# Patient Record
Sex: Male | Born: 1970 | Race: White | Hispanic: No | Smoking: Current every day smoker
Health system: Southern US, Community
[De-identification: ages and names within clinical notes are randomized; demographics above are authoritative.]

## PROBLEM LIST (undated history)

## (undated) DIAGNOSIS — K219 Gastro-esophageal reflux disease without esophagitis: Secondary | ICD-10-CM

## (undated) DIAGNOSIS — Z8709 Personal history of other diseases of the respiratory system: Secondary | ICD-10-CM

## (undated) DIAGNOSIS — J449 Chronic obstructive pulmonary disease, unspecified: Secondary | ICD-10-CM

---

## 2011-01-19 ENCOUNTER — Encounter (INDEPENDENT_AMBULATORY_CARE_PROVIDER_SITE_OTHER): Payer: Self-pay | Admitting: *Deleted

## 2011-01-19 ENCOUNTER — Encounter: Payer: Self-pay | Admitting: Emergency Medicine

## 2011-01-19 ENCOUNTER — Inpatient Hospital Stay (INDEPENDENT_AMBULATORY_CARE_PROVIDER_SITE_OTHER)
Admission: RE | Admit: 2011-01-19 | Discharge: 2011-01-19 | Disposition: A | Discharge status: Home / Self Care | Payer: 59 | Source: Ambulatory Visit | Attending: Emergency Medicine | Admitting: Emergency Medicine

## 2011-01-19 ENCOUNTER — Ambulatory Visit
Admission: RE | Admit: 2011-01-19 | Discharge: 2011-01-19 | Disposition: A | Discharge status: Home / Self Care | Payer: 59 | Source: Ambulatory Visit | Attending: Emergency Medicine | Admitting: Emergency Medicine

## 2011-01-19 ENCOUNTER — Other Ambulatory Visit: Payer: Self-pay | Admitting: Emergency Medicine

## 2011-01-19 DIAGNOSIS — R05 Cough: Secondary | ICD-10-CM

## 2011-01-19 DIAGNOSIS — R059 Cough, unspecified: Secondary | ICD-10-CM

## 2011-01-19 DIAGNOSIS — R062 Wheezing: Secondary | ICD-10-CM

## 2011-01-19 DIAGNOSIS — J180 Bronchopneumonia, unspecified organism: Secondary | ICD-10-CM

## 2011-01-20 ENCOUNTER — Telehealth (INDEPENDENT_AMBULATORY_CARE_PROVIDER_SITE_OTHER): Payer: Self-pay | Admitting: *Deleted

## 2011-01-31 ENCOUNTER — Institutional Professional Consult (permissible substitution): Payer: 59 | Admitting: Critical Care Medicine

## 2011-02-03 ENCOUNTER — Institutional Professional Consult (permissible substitution): Payer: 59 | Admitting: Emergency Medicine

## 2011-05-09 NOTE — Telephone Encounter (Signed)
  Phone Note Outgoing Call   Call placed by: Lajean Saver RN,  January 20, 2011 8:15 AM Call placed to: [facility] Action Taken: Appt scheduled Summary of Call: Appointment scheduled for [facility] on 01/31/2011 @ 1030am @ [facility]. Patient notified of appointment while still in the office.

## 2011-05-09 NOTE — Letter (Signed)
Summary: Out of Work  [facility]  [address]   [place], [place] [address]   Phone: [phone]  Fax: [phone]    January 19, 2011   Employee:  Devin Carter    To Whom It May Concern:   For Medical reasons, please excuse the above named employee from work for the following dates:  Start:   01/19/2011  Return: 01/20/2011  If you need additional information, please feel free to contact our office.         Sincerely,    Clemens Catholic LPN

## 2011-05-09 NOTE — Progress Notes (Signed)
Summary: chest congestion   Vital Signs:  Patient Profile:   40 Years Old Male CC:      Cold & URI symptoms x 1wk  Weight:      212 pounds O2 Sat:      97 % O2 treatment:    Room Air Temp:     99.0 degrees F oral Pulse rate:   70 / minute Resp:     18 per minute BP sitting:   133 / 88  (left arm) Cuff size:   regular  Vitals Entered By: Clemens Catholic LPN (January 19, 2011 2:17 PM)                 Serial Vital Signs/Assessments:                                PEF    PreRx  PostRx Time      O2 Sat  O2 Type     L/min  L/min  L/min   By 3:11 PM   97  %   Room air                          Clemens Catholic LPN  Comments: 1:61 PM after nebulizer treatment By: Clemens Catholic LPN     Updated Prior Medication List: OMEPRAZOLE 20 MG CPDR (OMEPRAZOLE)  XANAX 0.25 MG TABS (ALPRAZOLAM)  MINOCYCLINE HCL 100 MG CAPS (MINOCYCLINE HCL)   Current Allergies: ! * CHANTIXHistory of Present Illness History from: patient Chief Complaint: Cold & URI symptoms x 1wk  History of Present Illness: Pt complains of  7 days of congestion, colored rhinorrhea, sputum. No sore throat.  +cough, Minimal dyspnea. No chest pain. + wheezing.  No nausea No vomiting. + fever,T max 101 No chills. Was seen 3 months ago at [facility] medical crn[facility]  ER for pleural effusion , w/u with ct with contrast,  tx d with shot and by mouth steroids and abx, fu with his pcp Corinna Capra at [facility] 2 and 1/2 months ago . Has lost 15 lbs since. +smoker  REVIEW OF SYSTEMS Constitutional Symptoms       Complains of fever, chills, night sweats, and weight loss.     Denies weight gain and fatigue.  Eyes       Complains of change in vision.      Denies eye pain, eye discharge, glasses, contact lenses, and eye surgery. Ear/Nose/Throat/Mouth       Complains of frequent runny nose and sinus problems.      Denies hearing loss/aids, change in hearing, ear pain, ear discharge, dizziness, frequent nose  bleeds, sore throat, hoarseness, and tooth pain or bleeding.  Respiratory       Complains of productive cough, wheezing, shortness of breath, asthma, and bronchitis.      Denies dry cough and emphysema/COPD.  Cardiovascular       Denies murmurs, chest pain, and tires easily with exhertion.    Gastrointestinal       Denies stomach pain, nausea/vomiting, diarrhea, constipation, blood in bowel movements, and indigestion. Genitourniary       Complains of painful urination.      Denies kidney stones and loss of urinary control. Neurological       Complains of headaches.      Denies paralysis, seizures, and fainting/blackouts. Musculoskeletal       Denies muscle pain, joint pain,  joint stiffness, decreased range of motion, redness, swelling, muscle weakness, and gout.  Skin       Denies bruising, unusual mles/lumps or sores, and hair/skin or nail changes.  Psych       Complains of anxiety/stress.      Denies mood changes, temper/anger issues, speech problems, depression, and sleep problems. Other Comments: pt c/o cold s/s x 1wk. he states that his productive cough has been worse x 2days and he has had a low grade fever. he took codeine cough syrup before bed last night which helped a little.    Past History:  Past Medical History: pleuritis  Past Surgical History: Unremarkable  Family History: heart dz diabetes  Social History: Current Smoker 1- 1.5 PPD Alcohol use-yes 12 + per wk Drug use-no Smoking Status:  current Drug Use:  no Physical Exam General appearance: well developed, well nourished, no acute distress, fatigued. Coughing occasionally. Head: normocephalic, atraumatic Eyes: conjunctivae and lids normal. No icterus Ears: normal, no lesions or deformities Nasal: swollen red turbinates with congestion, mild yellow d/c Oral/Pharynx: pharyngeal erythema without exudate, uvula midline without deviation. No lesions seen Neck: neck supple,  trachea midline, no  masses Chest/Lungs: scattered late-exp wheezes throughout all lobes. scattered rhonchi. Mild rales right base. Breath sounds = bilat. 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  Heart size normal.  There is a small area of added density in the lower right hemithorax, which is superimposed with ribs.  Lungs are otherwise clear.  No pleural fluid. IMPRESSION: Added density in the lower right hemithorax may be due to a summation shadow.  True airspace disease or nodule cannot be excluded.  Follow-up in 4-6 weeks is recommended to ensure resolution."  After Duoneb tx, Lungs improved. Improved air movement. Less wheezing. He stated he was breathing better.  Plan New Medications/Changes: VENTOLIN HFA 108 (90 BASE) MCG/ACT AERS (ALBUTEROL SULFATE) 2 INH q 4-6 hrs as needed for wheezing  #1 INHALER x 0, 01/19/2011, Lajean Manes MD PREDNISONE 20 MG TABS (PREDNISONE) 1 by mouth two times a day for 7 days  #14 x 0, 01/19/2011, Lajean Manes MD LEVAQUIN 500 MG TABS (LEVOFLOXACIN) 1 by mouth daily for 10 days (antibiotic)  #10 x 0, 01/19/2011, Lajean Manes MD  New Orders: T-DG Chest 2 View [71020] Albuterol up to 2.5mg  with Ipratropium [J7620] Albuterol Sulfate Sol 1mg  unit dose [Z6109] Ipratropium inhalation sol. unit dose [J7644] Nebulizer Tx [94640] Depo- Medrol 80mg  [J1040] Rocephin  250mg  [J0696] New Patient Level IV [99204] Depo- Medrol 40mg  [J1030] Admin of Therapeutic Inj  intramuscular or subcutaneous [96372] Planning Comments:   Duoneb nebulizer tx here, with good results, see above. Rocephin and Depomedrol IM given after rba discussed. Discussed dx's above and the specific CXR  report above. I explained that I cannot rule out a lung mass, or even lung cancer. We discussed the importance of quitting smoking, taking meds as rx'd, and following up with lung specialist as instructed. Risks, benefits, alternatives discussed. I explained the risks of not following my advice , including the risks of worsening medical condition, or even death. Pt voiced understanding and agreement with my recommendations.  Follow Up: Will arrange f/u with a lung specialist within 3-4 weeks, sooner if worse or new symptoms.---Also, f/u with pcp for  all other medical concerns.  The patient and/or caregiver has been counseled thoroughly with regard to medications prescribed including dosage, schedule, interactions, rationale for use, and possible side effects and they verbalize understanding.  Diagnoses and expected course of recovery discussed and will return if not improved as expected or if the condition worsens. Patient and/or caregiver verbalized understanding.  Prescriptions: VENTOLIN HFA 108 (90 BASE) MCG/ACT AERS (ALBUTEROL SULFATE) 2 INH q 4-6 hrs as needed for wheezing  #1 INHALER x 0   Entered and Authorized by:   Lajean Manes MD   Signed by:   Lajean Manes MD on 01/19/2011   Method used:   Electronically to        [facility] [phone]* (retail)       [address]       [place], [place]  [address]       Ph: [phone]       Fax: [phone]   RxID:   [id number] PREDNISONE 20 MG TABS (PREDNISONE) 1 by mouth two times a day for 7 days  #14 x 0   Entered and Authorized by:   Lajean Manes MD   Signed by:   Lajean Manes MD on 01/19/2011   Method used:   Electronically to        [facility] [phone]* (retail)       [address]       [place], [place]  [address]       Ph: [phone]       Fax: [phone]   RxID:   [id number] LEVAQUIN 500 MG TABS (LEVOFLOXACIN) 1 by mouth daily for 10 days (antibiotic)  #10 x 0   Entered and Authorized by:   Lajean Manes MD    Signed by:   Lajean Manes MD on 01/19/2011   Method used:   Electronically to        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)    Route: IM    Site: RUOQ gluteus    Exp Date: 04/07/2011    Lot #: [id number]    Mfr: Pharmacia    Comments: 80 mg given     Patient tolerated injection without complications    Given by: Clemens Catholic LPN (January 19, 2011 3:44 PM)  Medication # 1:    Medication: Albuterol Sulfate Sol 1mg  unit dose    Diagnosis: WHEEZING (ICD-786.07)    Dose: 3.0mg      Route: inhaled    Exp Date: 04/07/2011    Lot #: [id number]    Mfr: mylan    Comments: douneb given    Patient tolerated medication without complications    Given by: Neysa Bonito  Locklear LPN (January 19, 2011 3:16 PM)  Medication # 2:    Medication: Ipratropium inhalation sol. unit dose    Diagnosis: WHEEZING (ICD-786.07)    Dose: 0.5mg      Route: inhaled    Exp Date: 04/07/2011    Lot #: [id number]    Mfr: mylan     Comments: douneb given     Patient tolerated medication without complications    Given by: Clemens Catholic LPN (January 19, 2011 3:17 PM)  Orders Added: 1)  T-DG Chest 2 View [71020] 2)  Albuterol up to 2.5mg  with Ipratropium [J7620] 3)  Albuterol Sulfate Sol 1mg  unit dose [J7613] 4)  Ipratropium inhalation sol. unit dose [J7644] 5)  Nebulizer Tx [94640] 6)  Depo- Medrol 80mg  [J1040] 7)   Rocephin  250mg  [J0696] 8)  New Patient Level IV [99204] 9)  Depo- Medrol 40mg  [J1030] 10)  Admin of Therapeutic Inj  intramuscular or subcutaneous [16109]

## 2011-09-01 ENCOUNTER — Encounter: Payer: Self-pay | Admitting: Emergency Medicine

## 2011-09-01 ENCOUNTER — Emergency Department: Admit: 2011-09-01 | Discharge: 2011-09-01 | Disposition: A | Discharge status: Home / Self Care | Payer: 59

## 2011-09-01 ENCOUNTER — Emergency Department
Admission: EM | Admit: 2011-09-01 | Discharge: 2011-09-01 | Disposition: A | Discharge status: Home / Self Care | Payer: 59 | Source: Home / Self Care | Attending: Emergency Medicine | Admitting: Emergency Medicine

## 2011-09-01 DIAGNOSIS — R0781 Pleurodynia: Secondary | ICD-10-CM

## 2011-09-01 DIAGNOSIS — R079 Chest pain, unspecified: Secondary | ICD-10-CM

## 2011-09-01 MED ORDER — TRAMADOL-ACETAMINOPHEN 37.5-325 MG PO TABS: 1.0000 | ORAL_TABLET | Freq: Four times a day (QID) | ORAL | Status: AC | PRN

## 2011-09-01 NOTE — ED Provider Notes (Signed)
History     CSN: [id number]  Arrival date & time 09/01/11  1305   First MD Initiated Contact with Patient 09/01/11 1321      Chief Complaint  Patient presents with  . Chest Pain    (Consider location/radiation/quality/duration/timing/severity/associated sxs/prior treatment) HPI This patient presents today with left lower rib pain.  He states that he sneezed this morning and heard and felt a pop in his lower left chest.  He states that since then he has had discomfort and tenderness that he describes as a constant sharp pain.  Otherwise no recent illnesses, no other chest pain, shortness of breath, or cardiac symptoms.  He has broken a rib previously on the right side.  No recent heavy lifting, pushing, pulling.  He is here to make sure that he did not break anything.  History reviewed. No pertinent past medical history.  History reviewed. No pertinent past surgical history.  Family History  Problem Relation Age of Onset  . Heart failure Mother   . Hyperlipidemia Mother   . Diabetes Mother   . Heart failure Father     History  Substance Use Topics  . Smoking status: Current Everyday Smoker -- 1.5 packs/day for 30 years  . Smokeless tobacco: Not on file  . Alcohol Use: Yes      Review of Systems  All other systems reviewed and are negative.    Allergies  Varenicline tartrate  Home Medications  No current outpatient prescriptions on file.  BP 129/84  Pulse 75  Temp(Src) 98.1 F (36.7 C) (Oral)  Resp 16  Ht 6\' 1"  (1.854 m)  Wt 227 lb (102.967 kg)  BMI 29.95 kg/m2  SpO2 98%  Physical Exam  Nursing note and vitals reviewed. Constitutional: He is oriented to person, place, and time. He appears well-developed and well-nourished.  HENT:  Head: Normocephalic and atraumatic.  Eyes: No scleral icterus.  Neck: Neck supple.  Cardiovascular: Normal rate, regular rhythm and normal heart sounds.   Pulmonary/Chest: Effort normal and breath sounds normal. No accessory  muscle usage. No apnea. No respiratory distress. He has no decreased breath sounds. He has no wheezes. He has no rhonchi.         Tenderness to palpation is shown in the figure.  No crepitus felt, no swelling, no bruising.  Neurological: He is alert and oriented to person, place, and time.  Skin: Skin is warm and dry.  Psychiatric: He has a normal mood and affect. His speech is normal.    ED Course  Procedures (including critical care time)  Labs Reviewed - No data to display Dg Ribs Unilateral W/chest Left  09/01/2011  *RADIOLOGY REPORT*  Clinical Data: Left lower rib pain.  Sneezed and felt a pop.  Rule out fracture.  LEFT RIBS AND CHEST - 3+ VIEW  Comparison: 01/19/2011  Findings: Frontal view of the chest demonstrates midline trachea. Normal heart size and mediastinal contours. No pleural effusion or pneumothorax.  Clear lungs.  2 views of left sided ribs. Radiographic marker projects over the posterior left twelfth rib. No displaced rib fracture identified.  IMPRESSION: No displaced rib fracture or pneumothorax. No acute cardiopulmonary disease.  Original Report Authenticated By: Consuello Bossier, M.D.     1. Rib pain       MDM   An x-ray was obtained and read by the radiologist as above.  We tried a rib belt on him to see if that would relieve some of the pressure.  Advised ice and  avoiding heavy lifting, pushing, pulling.  Rx for Ultracet given.  Marlaine Hind, MD 09/01/11 1351

## 2011-09-01 NOTE — ED Notes (Signed)
Left rib pain, sneezed thi morning and heard a pop, pain immediately

## 2012-04-03 ENCOUNTER — Emergency Department (INDEPENDENT_AMBULATORY_CARE_PROVIDER_SITE_OTHER): Payer: 59

## 2012-04-03 ENCOUNTER — Emergency Department
Admission: EM | Admit: 2012-04-03 | Discharge: 2012-04-03 | Disposition: A | Discharge status: Home / Self Care | Payer: 59 | Source: Home / Self Care | Attending: Family Medicine | Admitting: Family Medicine

## 2012-04-03 ENCOUNTER — Encounter: Payer: Self-pay | Admitting: *Deleted

## 2012-04-03 DIAGNOSIS — J209 Acute bronchitis, unspecified: Secondary | ICD-10-CM

## 2012-04-03 DIAGNOSIS — R05 Cough: Secondary | ICD-10-CM

## 2012-04-03 DIAGNOSIS — R0781 Pleurodynia: Secondary | ICD-10-CM

## 2012-04-03 DIAGNOSIS — R6883 Chills (without fever): Secondary | ICD-10-CM

## 2012-04-03 DIAGNOSIS — M549 Dorsalgia, unspecified: Secondary | ICD-10-CM

## 2012-04-03 DIAGNOSIS — R0789 Other chest pain: Secondary | ICD-10-CM

## 2012-04-03 DIAGNOSIS — R079 Chest pain, unspecified: Secondary | ICD-10-CM

## 2012-04-03 HISTORY — DX: Chronic obstructive pulmonary disease, unspecified: J44.9

## 2012-04-03 HISTORY — DX: Gastro-esophageal reflux disease without esophagitis: K21.9

## 2012-04-03 HISTORY — DX: Personal history of other diseases of the respiratory system: Z87.09

## 2012-04-03 LAB — POCT URINALYSIS DIPSTICK
Blood, UA: NEGATIVE
Leukocytes, UA: NEGATIVE
Nitrite, UA: NEGATIVE
Protein, UA: NEGATIVE
pH, UA: 7.5 (ref 5–8)

## 2012-04-03 LAB — POCT CBC W AUTO DIFF (K'VILLE URGENT CARE)

## 2012-04-03 MED ORDER — AZITHROMYCIN 250 MG PO TABS: ORAL_TABLET | ORAL | Status: DC

## 2012-04-03 MED ORDER — BENZONATATE 200 MG PO CAPS: 200.0000 mg | ORAL_CAPSULE | Freq: Every day | ORAL | Status: DC

## 2012-04-03 NOTE — ED Provider Notes (Signed)
History     CSN: [id number]  Arrival date & time 04/03/12  4098   First MD Initiated Contact with Patient 04/03/12 [phone]      Chief Complaint  Patient presents with  . Back Pain     HPI Comments: Patient complains of onset of right sided posterior rib pain at about 11AM yesterday.  He denies recent trauma to his chest.  The pain is worse with inspiration and movement.  He notes that he has had some mild cough for about a week, now worse at night.  The cough is generally productive. He denies wheezing but has mild shortness of breath with activity, improved with his albuterol inhaler.  He had chills/sweats last night but no fever.  He also recalls having had some vague urinary symptoms with urgency last week but it resolved with increased fluid intake. He states that he had sudden onset of right posterior rib pain last year, and subsequently developed a pleural effusion on the right.  He continues to smoke.  The history is provided by the patient.    Past Medical History  Diagnosis Date  . GERD (gastroesophageal reflux disease)   . Skin disease   . COPD (chronic obstructive pulmonary disease)   . Hx of pleurisy     History reviewed. No pertinent past surgical history.  Family History  Problem Relation Age of Onset  . Heart failure Mother   . Hyperlipidemia Mother   . Diabetes Mother   . Heart failure Father     History  Substance Use Topics  . Smoking status: Current Every Day Smoker -- 1.5 packs/day for 30 years  . Smokeless tobacco: Not on file  . Alcohol Use: Yes     1/2 gallon of liquor per wk      Review of Systems No sore throat + cough + pleuritic pain on right No wheezing Minimal nasal congestion No post-nasal drainage No sinus pain/pressure No itchy/red eyes No earache No hemoptysis + mild SOB at times No fever, + chills/sweats last night No nausea No vomiting No abdominal pain No diarrhea No urinary symptoms No skin rashes + fatigue +  myalgias + headache    Allergies  Varenicline tartrate  Home Medications   Current Outpatient Rx  Name Route Sig Dispense Refill  . MINOCYCLINE HCL 100 MG PO CAPS Oral Take 100 mg by mouth 2 (two) times daily.    Marland Kitchen OMEPRAZOLE 20 MG PO CPDR Oral Take 20 mg by mouth daily.    . AZITHROMYCIN 250 MG PO TABS  Take 2 tabs today; then begin one tab once daily for 4 more days. 6 each 0  . BENZONATATE 200 MG PO CAPS Oral Take 1 capsule (200 mg total) by mouth at bedtime. Take as needed for cough 12 capsule 0    BP 124/86  Pulse 64  Temp 98.3 F (36.8 C) (Oral)  Resp 18  Ht 6\' 1"  (1.854 m)  Wt 225 lb (102.059 kg)  BMI 29.69 kg/m2  SpO2 98%  Physical Exam Nursing notes and Vital Signs reviewed. Appearance:  Patient appears healthy, stated age, and in no acute distress Eyes:  Pupils are equal, round, and reactive to light and accomodation.  Extraocular movement is intact.  Conjunctivae are not inflamed  Ears:  Canals normal.  Tympanic membranes normal.  Nose:  Mildly congested turbinates.  No sinus tenderness.   Pharynx:  Normal Neck:  Supple.   No adenopathy Lungs:   Few faint wheezes over right posterior  chest.  Breath sounds are equal.  Chest:  Tenderness right posterior/lateral inferior ribs extending to flank just below. Heart:  Regular rate and rhythm without murmurs, rubs, or gallops.  Abdomen:  Nontender without masses or hepatosplenomegaly.  Bowel sounds are present.  No CVA or flank tenderness.  Extremities:  No edema.  No calf tenderness Skin:  No rash present.   ED Course  Procedures     Labs Reviewed  POCT URINALYSIS DIPSTICK negative  POCT CBC W AUTO DIFF ([facility])  WBC 10.3; LY 21.8; MO 6.0; GR 72.2; Hgb 16.8; Platelets 257    Dg Chest 2 View  04/03/2012  *RADIOLOGY REPORT*  Clinical Data: Cough and chills.  CHEST - 2 VIEW  Comparison: 09/01/2011.  Findings: The cardiac silhouette, mediastinal and hilar contours are within normal limits and stable.   The lungs are clear.  No infiltrates, edema or effusions.  Slightly low lung volumes with minimal vascular crowding and streaky basilar atelectasis.  The bony thorax is intact.  There is a stable healed right eighth rib fracture.  IMPRESSION:  No acute cardiopulmonary findings.  Minimal streaky basilar atelectasis.   Original Report Authenticated By: P. Loralie Champagne, M.D.    Dg Ribs Unilateral Right  04/03/2012  *RADIOLOGY REPORT*  Clinical Data: Right-sided rib pain and coughing.  RIGHT RIBS - 2 VIEW  Comparison: None.  Findings: There is a remote healed right ninth rib fracture.  No definite acute rib fractures.  IMPRESSION: No acute right-sided rib fractures.   Original Report Authenticated By: P. Loralie Champagne, M.D.      1. Rib pain on right side; note healed old right rib fracture at site of patient's present rib pain   2. Acute bronchitis       MDM  Begin a Z-pack.  Prescription written for Benzonatate [facility]) to take at bedtime for night-time cough.  Take plain Mucinex (guaifenesin) twice daily for cough and congestion.  Increase fluid intake, rest. May use Afrin nasal spray (or generic oxymetazoline) twice daily for about 5 days.  Also recommend using saline nasal spray several times daily and saline nasal irrigation (AYR is a common brand) Stop all antihistamines for now, and other non-prescription cough/cold preparations. May continue albuterol inhaler as needed. Try using rib belt for rib pain (has one at home) Follow-up with family doctor if not improving 7 to 10 days.        [name], MD 04/03/12 [phone]

## 2012-04-03 NOTE — ED Notes (Signed)
Pt c/o RT sided back pain/rib cage pain x 1 day. Denies injury. He reports a hx of pleurisy.

## 2012-04-04 ENCOUNTER — Telehealth: Payer: Self-pay | Admitting: Emergency Medicine

## 2013-08-01 IMAGING — CR DG RIBS W/ CHEST 3+V*L*
3 series · 3 of 3 positions shown · non-contrast
Comparison: 01/19/2011

CLINICAL DATA: Left lower rib pain.  Sneezed and felt a pop.  Rule
out fracture.

LEFT RIBS AND CHEST - 3+ VIEW

[view not recorded (1 of 3)]
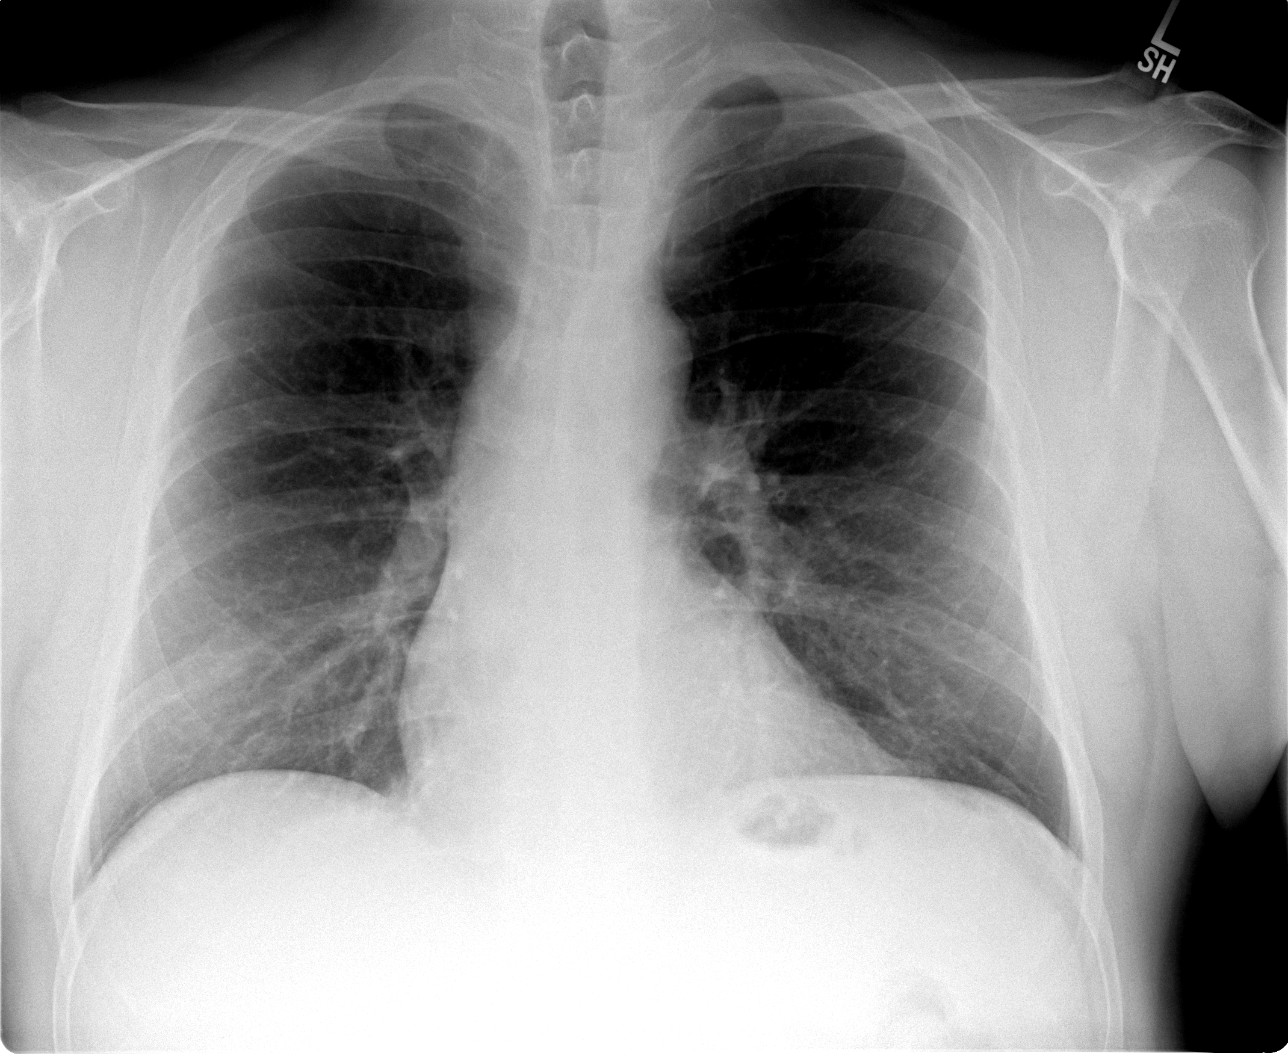

[view not recorded (2 of 3)]
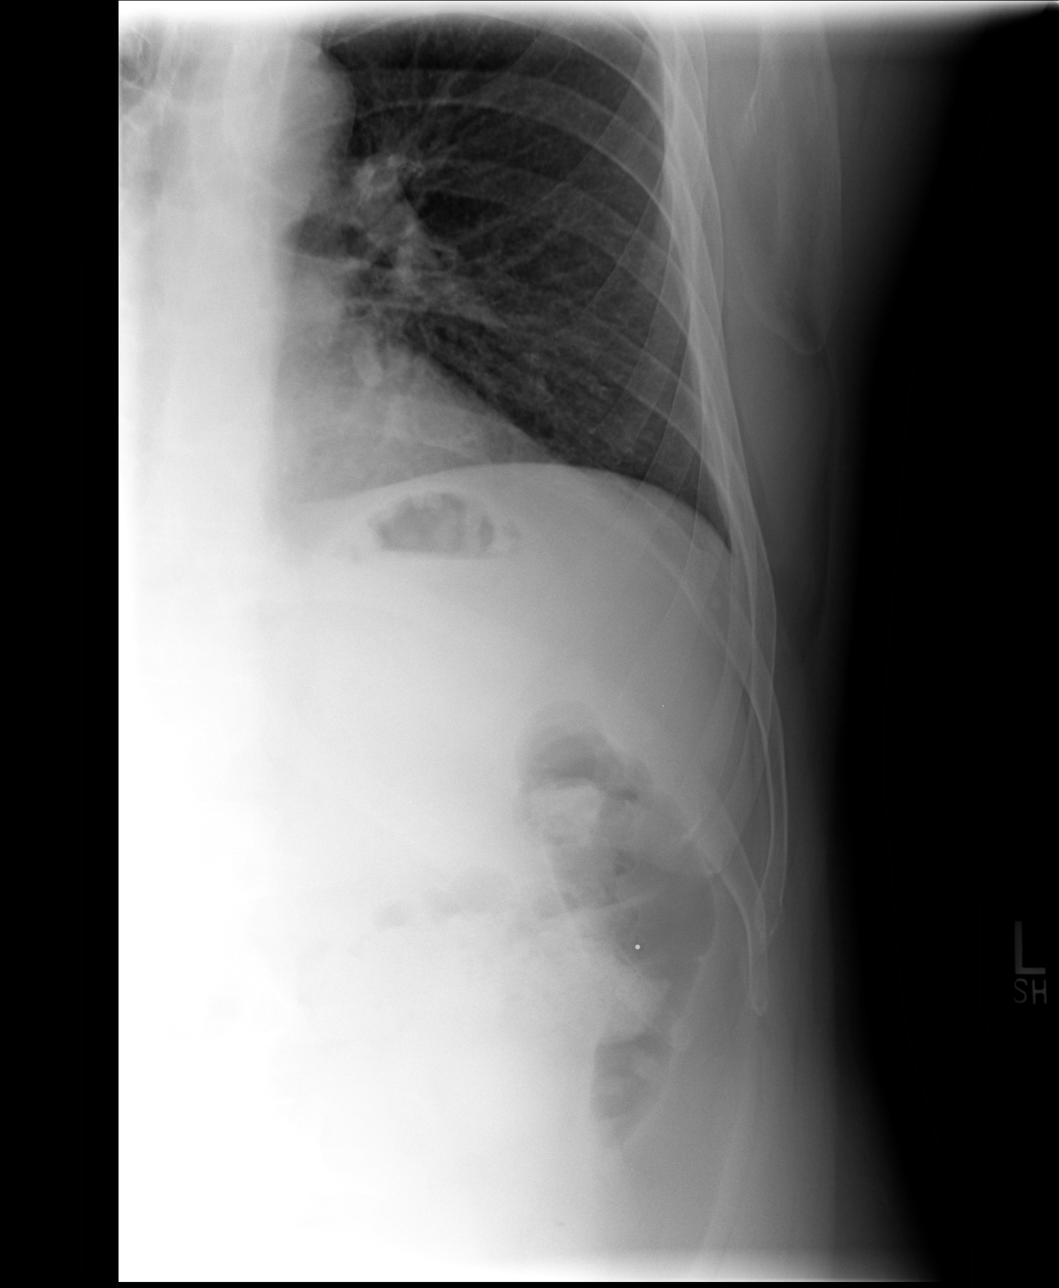

[view not recorded (3 of 3)]
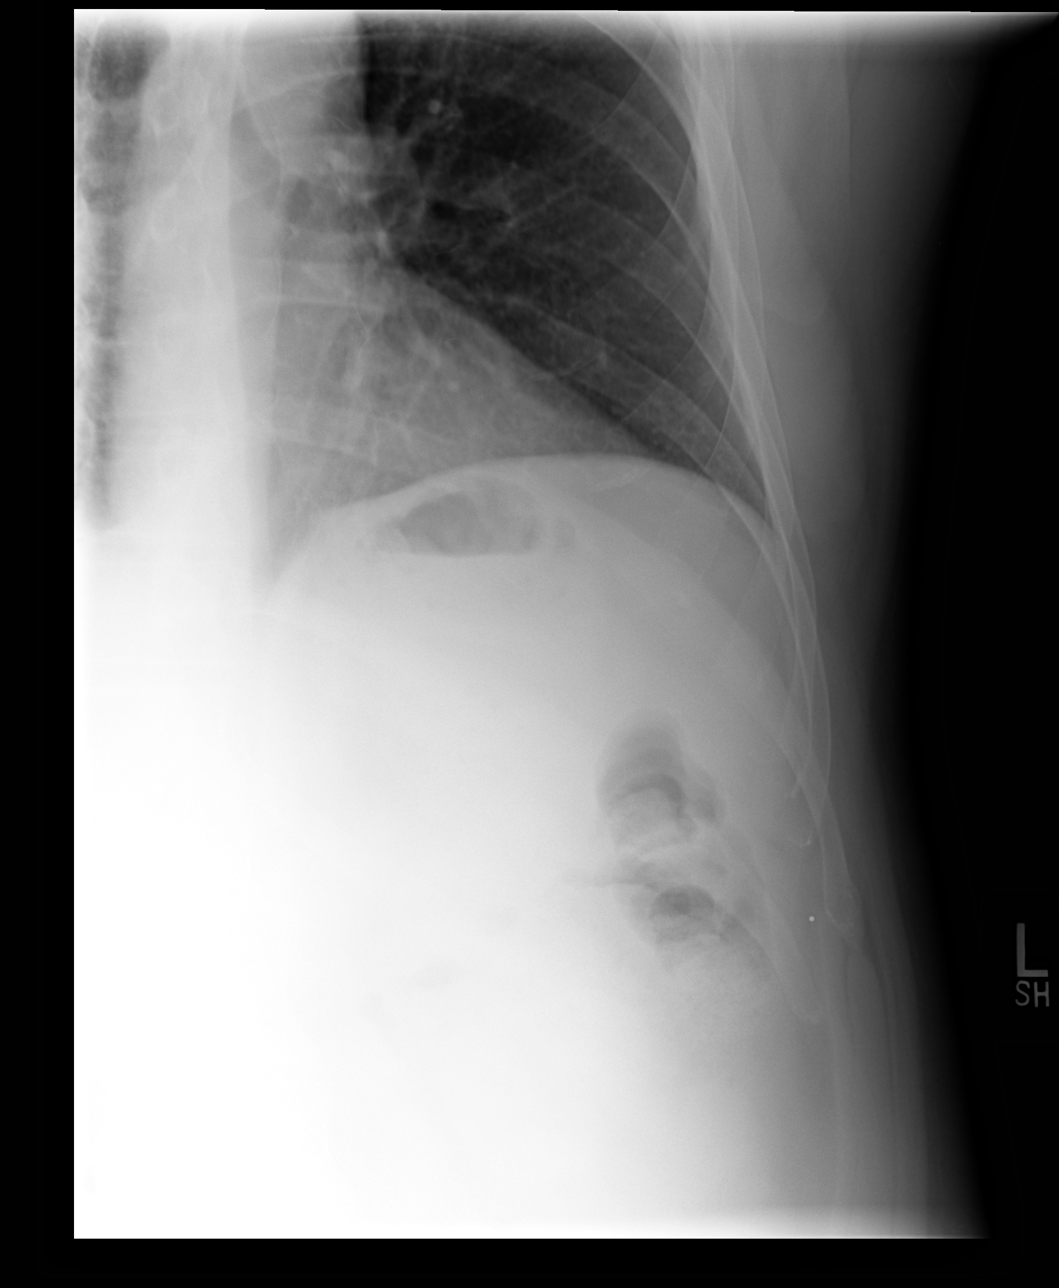

[3 of 3 positions shown; findings below may reference images not displayed]

FINDINGS: Frontal view of the chest demonstrates midline trachea.
Normal heart size and mediastinal contours. No pleural effusion or
pneumothorax.  Clear lungs.  2 views of left sided ribs.
Radiographic marker projects over the posterior left twelfth rib.
No displaced rib fracture identified.
IMPRESSION: No displaced rib fracture or pneumothorax. No acute cardiopulmonary
disease.

## 2014-01-03 LAB — HEPATIC FUNCTION PANEL
ALK PHOS: 71 U/L (ref 25–125)
ALT: 45 U/L — AB (ref 10–40)
AST: 26 U/L (ref 14–40)

## 2014-01-03 LAB — BASIC METABOLIC PANEL
CREATININE: 1 mg/dL (ref 0.6–1.3)
Glucose: 84 mg/dL
Potassium: 4.4 mmol/L (ref 3.4–5.3)
SODIUM: 140 mmol/L (ref 137–147)

## 2014-01-03 LAB — LIPID PANEL
CHOLESTEROL: 234 mg/dL — AB (ref 0–200)
HDL: 139 mg/dL — AB (ref 35–70)
Triglycerides: 259 mg/dL — AB (ref 40–160)

## 2014-08-15 ENCOUNTER — Emergency Department
Admission: EM | Admit: 2014-08-15 | Discharge: 2014-08-15 | Disposition: A | Discharge status: Home / Self Care | Payer: 59 | Source: Home / Self Care | Attending: Emergency Medicine | Admitting: Emergency Medicine

## 2014-08-15 ENCOUNTER — Encounter: Payer: Self-pay | Admitting: *Deleted

## 2014-08-15 DIAGNOSIS — J069 Acute upper respiratory infection, unspecified: Secondary | ICD-10-CM | POA: Diagnosis not present

## 2014-08-15 LAB — POCT INFLUENZA A/B
INFLUENZA A, POC: NEGATIVE
INFLUENZA B, POC: NEGATIVE

## 2014-08-15 MED ORDER — OSELTAMIVIR PHOSPHATE 75 MG PO CAPS: 75.0000 mg | ORAL_CAPSULE | Freq: Two times a day (BID) | ORAL | Status: DC

## 2014-08-15 NOTE — ED Provider Notes (Addendum)
CSN: [id number]     Arrival date & time 08/15/14  1431 History   First MD Initiated Contact with Patient 08/15/14 1437     Chief Complaint  Patient presents with  . Fever  . Cough   (Consider location/radiation/quality/duration/timing/severity/associated sxs/prior Treatment) HPI Devin Carter is a [age] y.o. male who complains of onset of cold symptoms for 2 days.  The symptoms are constant and mild-moderate in severity.  Recently his sister to [place]. No sore throat + cough No pleuritic pain No wheezing + nasal congestion + post-nasal drainage No sinus pain/pressure No chest congestion + itchy/red eyes No earache No hemoptysis No SOB + chills/sweats + fever (Tmas 102.9 last night) No nausea No vomiting No abdominal pain No diarrhea No skin rashes ++ fatigue + myalgias + headache     Past Medical History  Diagnosis Date  . GERD (gastroesophageal reflux disease)   . Skin disease   . COPD (chronic obstructive pulmonary disease)   . Hx of pleurisy    History reviewed. No pertinent past surgical history. Family History  Problem Relation Age of Onset  . Heart failure Mother   . Hyperlipidemia Mother   . Diabetes Mother   . Heart failure Father    History  Substance Use Topics  . Smoking status: Current Every Day Smoker -- 1.50 packs/day for 30 years  . Smokeless tobacco: Not on file  . Alcohol Use: Yes     Comment: 1/2 gallon of liquor per wk    Review of Systems  All other systems reviewed and are negative.   Allergies  Varenicline tartrate  Home Medications   Prior to Admission medications   Medication Sig Start Date End Date Taking? Authorizing Provider  azithromycin (ZITHROMAX Z-PAK) 250 MG tablet Take 2 tabs today; then begin one tab once daily for 4 more days. 04/03/12   Lattie HawStephen A Beese, MD  benzonatate (TESSALON) 200 MG capsule Take 1 capsule (200 mg total) by mouth at bedtime. Take as needed for cough 04/03/12   Lattie HawStephen A Beese, MD  minocycline  (MINOCIN,DYNACIN) 100 MG capsule Take 100 mg by mouth 2 (two) times daily.    Historical Provider, MD  omeprazole (PRILOSEC) 20 MG capsule Take 20 mg by mouth daily.    Historical Provider, MD  oseltamivir (TAMIFLU) 75 MG capsule Take 1 capsule (75 mg total) by mouth 2 (two) times daily. 08/15/14   Devin HindJeffrey H Henderson, MD   BP 123/83 mmHg  Pulse 95  Temp(Src) 99.6 F (37.6 C) (Oral)  Resp 18  Ht 6\' 1"  (1.854 m)  Wt 216 lb (97.977 kg)  BMI 28.50 kg/m2  SpO2 96% Physical Exam  Constitutional: He is oriented to person, place, and time. He appears well-developed and well-nourished.  HENT:  Head: Normocephalic and atraumatic.  Right Ear: External ear and ear canal normal. Tympanic membrane is erythematous.  Left Ear: External ear and ear canal normal. Tympanic membrane is erythematous.  Nose: Mucosal edema present.  Mouth/Throat: Posterior oropharyngeal erythema present. No oropharyngeal exudate or posterior oropharyngeal edema.  Eyes: No scleral icterus.  Neck: Neck supple.  Cardiovascular: Regular rhythm and normal heart sounds.   Pulmonary/Chest: Effort normal and breath sounds normal. No respiratory distress. He has no decreased breath sounds. He has no wheezes. He has no rhonchi.  Neurological: He is alert and oriented to person, place, and time.  Skin: Skin is warm and dry.  Psychiatric: He has a normal mood and affect. His speech is normal.  Nursing note and vitals  reviewed.   ED Course  Procedures (including critical care time) Labs Review Labs Reviewed  POCT INFLUENZA A/B    Imaging Review No results found.   MDM   1. Viral upper respiratory illness     1)  Take the prescribed Tamiflu as instructed.  Rapid flu test is officially negative, but patient with very consistent symptoms.  Also already on minocycline. 2)  Use nasal saline solution (over the counter) at least 3 times a day. 3)  Use over the counter decongestants like Zyrtec-D every 12 hours as needed to  help with congestion.  If you have hypertension, do not take medicines with sudafed.  4)  Can take tylenol every 6 hours or motrin every 8 hours for pain or fever. 5)  Follow up with your primary doctor if no improvement in 5-7 days, sooner if increasing pain, fever, or new symptoms.       Devin Hind, MD 08/15/14 1538  Devin Hind, MD 08/15/14 [phone]

## 2014-08-15 NOTE — ED Notes (Signed)
Pt c/o fever up to 102.9, cough, body aches, bilateral eye redness and d/c, and fatigue x last night.

## 2014-08-21 ENCOUNTER — Ambulatory Visit (INDEPENDENT_AMBULATORY_CARE_PROVIDER_SITE_OTHER): Payer: 59 | Admitting: Family Medicine

## 2014-08-21 ENCOUNTER — Encounter: Payer: Self-pay | Admitting: Family Medicine

## 2014-08-21 VITALS — BP 124/81 | HR 75 | Ht 73.0 in | Wt 212.0 lb

## 2014-08-21 DIAGNOSIS — J4520 Mild intermittent asthma, uncomplicated: Secondary | ICD-10-CM | POA: Diagnosis not present

## 2014-08-21 DIAGNOSIS — F411 Generalized anxiety disorder: Secondary | ICD-10-CM

## 2014-08-21 MED ORDER — CLONAZEPAM 0.5 MG PO TABS: 0.2500 mg | ORAL_TABLET | Freq: Every day | ORAL | Status: DC | PRN

## 2014-08-21 MED ORDER — ALBUTEROL SULFATE HFA 108 (90 BASE) MCG/ACT IN AERS: 2.0000 | INHALATION_SPRAY | Freq: Four times a day (QID) | RESPIRATORY_TRACT | Status: DC | PRN

## 2014-08-21 MED ORDER — ALBUTEROL SULFATE 108 (90 BASE) MCG/ACT IN AEPB: 2.0000 | INHALATION_SPRAY | Freq: Four times a day (QID) | RESPIRATORY_TRACT | Status: DC | PRN

## 2014-08-21 NOTE — Progress Notes (Signed)
CC: Devin GillisVictor Carter is a 44 y.o. male is here for Establish Care   Subjective: HPI:  Pleasant 44 year old here to establish care  Tells me he was diagnosed with flu last Friday. He's been taking Tamiflu on a daily basis and completed a full 5 day course. His temperature peaked at 102 and he's been without fever for at least the past 3 or 4 days. He still has a productive cough and wheezing that is present on a daily basis that is significantly improved with using albuterol however he ran out of it last night. He has a history of wheezing and shortness of breath that occurs whenever he gets a flare up of seasonal allergies or bacterial/viral infections in the pulmonary tract. Overall he says his symptoms are improving but still bothering quality of life somewhere between mild to moderate in severity.  When he is in his regular state of health he will go weeks without needing albuterol.  He tells me he has a history of anxiety that has required Xanax in the past. His former provider tapered him off of this medication. He tells me that he is experiencing anxiety on a daily basis it's worse when he is at work. 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 He tells me he gets anxious to the point where he has a tremor in the hands but it'll go away if he can calm down. He feels that his anxiety is moderate to severe most days of the week and he is constantly thinking about what is going to be blamed about next at work. Denies any other mental disturbance nor substance abuse.  Review of Systems - General ROS: negative for - chills, fever, night sweats, weight gain or weight loss Ophthalmic ROS: negative for - decreased vision Psychological ROS: negative for - depression ENT ROS: negative for - hearing change, nasal congestion, tinnitus or allergies Hematological and Lymphatic ROS: negative  for - bleeding problems, bruising or swollen lymph nodes Breast ROS: negative Respiratory ROS: no blood and sputum  Cardiovascular ROS: no chest pain or dyspnea on exertion Gastrointestinal ROS: no abdominal pain, change in bowel habits, or black or bloody stools Genito-Urinary ROS: negative for - genital discharge, genital ulcers, incontinence or abnormal bleeding from genitals Musculoskeletal ROS: negative for - joint pain or muscle pain Neurological ROS: negative for - headaches or memory loss Dermatological ROS: negative for lumps, mole changes, rash and skin lesion changes  Past Medical History  Diagnosis Date  . GERD (gastroesophageal reflux disease)   . Skin disease   . COPD (chronic obstructive pulmonary disease)   . Hx of pleurisy     History reviewed. No pertinent past surgical history. Family History  Problem Relation Age of Onset  . Heart failure Mother   . Hyperlipidemia Mother   . Diabetes Mother   . Heart failure Father     History   Social History  . Marital Status: Divorced    Spouse Name: N/A  . Number of Children: N/A  . Years of Education: N/A   Occupational History  . Not on file.   Social History Main Topics  . Smoking status: Current Every Day Smoker -- 1.50 packs/day for 30 years  . Smokeless tobacco: Not on file  . Alcohol Use: 0.0 oz/week    0 Standard drinks or equivalent per week  Comment: 1/2 gallon of liquor per wk  . Drug Use: No  . Sexual Activity:    Partners: Female   Other Topics Concern  . Not on file   Social History Narrative     Objective: BP 124/81 mmHg  Pulse 75  Ht  (1.854 m)  Wt 212 lb (96.163 kg)  BMI 27.98 kg/m2  General: Alert and Oriented, No Acute Distress HEENT: Pupils equal, round, reactive to light. Conjunctivae clear.  External ears unremarkable, canals clear with intact TMs with appropriate landmarks.  Middle ear appears open without effusion. Pink inferior turbinates.  Moist mucous membranes,  pharynx without inflammation nor lesions.  Neck supple without palpable lymphadenopathy nor abnormal masses. Lungs: Comfortable work of breathing with mild end expiratory wheezing in all lung fields. No rhonchi nor rales. Cardiac: Regular rate and rhythm. Normal S1/S2.  No murmurs, rubs, nor gallops.   Extremities: No peripheral edema.  Strong peripheral pulses.  Mental Status: No depression nor agitation. Mild anxiety. Skin: Warm and dry.  Assessment & Plan: Devin Carter was seen today for establish care.  Diagnoses and all orders for this visit:  Generalized anxiety disorder Orders: -     clonazePAM (KLONOPIN) 0.5 MG tablet; Take 0.5-1 tablets (0.25-0.5 mg total) by mouth daily as needed for anxiety.  Reactive airway disease, mild intermittent, uncomplicated Orders: -     Albuterol Sulfate (PROAIR RESPICLICK) 108 (90 BASE) MCG/ACT AEPB; Inhale 2 puffs into the lungs every 6 (six) hours as needed (wheezing or shortness of breath.).  Other orders -     albuterol (PROVENTIL HFA;VENTOLIN HFA) 108 (90 BASE) MCG/ACT inhaler; Inhale 2 puffs into the lungs every 6 (six) hours as needed for wheezing or shortness of breath.   Generalized anxiety disorder: Start as needed clonazepam, urged to try this on a daily he is not at work to gauge side effects Reactive airway disease: Uncontrolled chronic condition, restart albuterol call if no better in 1 day and prednisone will be the next intervention. I given him to formulations of albuterol in hopes that the dry powder version will be effective and financially cheaper.  Return in about 4 weeks (around 09/18/2014) for Anxiety and Breathing.

## 2014-09-11 ENCOUNTER — Encounter: Payer: Self-pay | Admitting: Family Medicine

## 2014-09-11 DIAGNOSIS — E785 Hyperlipidemia, unspecified: Secondary | ICD-10-CM | POA: Insufficient documentation

## 2014-09-11 DIAGNOSIS — J449 Chronic obstructive pulmonary disease, unspecified: Secondary | ICD-10-CM | POA: Insufficient documentation

## 2014-09-11 DIAGNOSIS — K219 Gastro-esophageal reflux disease without esophagitis: Secondary | ICD-10-CM | POA: Insufficient documentation

## 2014-09-25 ENCOUNTER — Encounter: Payer: Self-pay | Admitting: Family Medicine

## 2014-09-25 LAB — COMPREHENSIVE METABOLIC PANEL: CALCIUM: 9.3 mg/dL

## 2014-10-21 ENCOUNTER — Ambulatory Visit (INDEPENDENT_AMBULATORY_CARE_PROVIDER_SITE_OTHER): Payer: 59 | Admitting: Family Medicine

## 2014-10-21 ENCOUNTER — Encounter: Payer: Self-pay | Admitting: Family Medicine

## 2014-10-21 VITALS — BP 144/99 | HR 79 | Ht 73.0 in | Wt 213.0 lb

## 2014-10-21 DIAGNOSIS — Z202 Contact with and (suspected) exposure to infections with a predominantly sexual mode of transmission: Secondary | ICD-10-CM | POA: Diagnosis not present

## 2014-10-21 NOTE — Progress Notes (Signed)
CC: Devin GillisVictor Carter is a 44 y.o. male is here for possible STI   Subjective: HPI:  Recently broke up with girlfriend and in hindsight is realizing that she may have been suffering from a STD without letting him know. He denies any personal symptoms. Specifically denies any fevers, chills, dysuria, penile discharge, testicular pain or penile lesions. Has never been tested for STDs before and is concerned he may contracted something   Review Of Systems Outlined In HPI  Past Medical History  Diagnosis Date  . GERD (gastroesophageal reflux disease)   . COPD (chronic obstructive pulmonary disease)   . Hx of pleurisy     No past surgical history on file. Family History  Problem Relation Age of Onset  . Heart failure Mother   . Hyperlipidemia Mother   . Diabetes Mother   . Heart failure Father     History   Social History  . Marital Status: Divorced    Spouse Name: N/A  . Number of Children: N/A  . Years of Education: N/A   Occupational History  . Not on file.   Social History Main Topics  . Smoking status: Current Every Day Smoker -- 1.50 packs/day for 30 years  . Smokeless tobacco: Not on file  . Alcohol Use: 0.0 oz/week    0 Standard drinks or equivalent per week     Comment: 1/2 gallon of liquor per wk  . Drug Use: No  . Sexual Activity:    Partners: Female   Other Topics Concern  . Not on file   Social History Narrative     Objective: BP 144/99 mmHg  Pulse 79  Ht 6\' 1"  (1.854 m)  Wt 213 lb (96.616 kg)  BMI 28.11 kg/m2  Vital signs reviewed. General: Alert and Oriented, No Acute Distress HEENT: Pupils equal, round, reactive to light. Conjunctivae clear.  External ears unremarkable.  Moist mucous membranes. Lungs: Clear and comfortable work of breathing, speaking in full sentences without accessory muscle use. Cardiac: Regular rate and rhythm.  Neuro: CN II-XII grossly intact, gait normal. Extremities: No peripheral edema.  Strong peripheral pulses.  Mental  Status: No depression, anxiety, nor agitation. Logical though process. Skin: Warm and dry.  Assessment & Plan: Devin Carter was seen today for possible sti.  Diagnoses and all orders for this visit:  STD exposure Orders: -     GC/chlamydia probe amp, urine -     RPR -     HIV antibody -     Hepatitis C antibody -     HSV 1 antibody, IgG -     HSV 2 antibody, IgG   Screen for diseases stated above after getting verbal consent from patient to look into these conditions. ultimate plan will be based on the results of the above tests.  Return if symptoms worsen or fail to improve.

## 2014-10-22 LAB — HSV 2 ANTIBODY, IGG: HSV 2 Glycoprotein G Ab, IgG: <0.1 IV

## 2014-10-22 LAB — GC/CHLAMYDIA PROBE AMP, URINE
CHLAMYDIA, SWAB/URINE, PCR: NEGATIVE
GC PROBE AMP, URINE: NEGATIVE

## 2014-10-22 LAB — RPR

## 2014-10-22 LAB — HSV 1 ANTIBODY, IGG: HSV 1 Glycoprotein G Ab, IgG: 11.08 IV — ABNORMAL HIGH

## 2014-10-22 LAB — HEPATITIS C ANTIBODY: HCV Ab: NEGATIVE

## 2014-10-22 LAB — HIV ANTIBODY (ROUTINE TESTING W REFLEX): HIV 1&2 Ab, 4th Generation: NONREACTIVE

## 2014-10-23 ENCOUNTER — Encounter: Payer: Self-pay | Admitting: Family Medicine

## 2014-10-23 DIAGNOSIS — B009 Herpesviral infection, unspecified: Secondary | ICD-10-CM | POA: Insufficient documentation

## 2014-12-29 ENCOUNTER — Encounter: Payer: Self-pay | Admitting: Family Medicine

## 2014-12-29 ENCOUNTER — Ambulatory Visit (INDEPENDENT_AMBULATORY_CARE_PROVIDER_SITE_OTHER): Payer: 59 | Admitting: Family Medicine

## 2014-12-29 VITALS — BP 141/87 | HR 91 | Temp 98.7°F | Wt 196.0 lb

## 2014-12-29 DIAGNOSIS — R319 Hematuria, unspecified: Secondary | ICD-10-CM | POA: Diagnosis not present

## 2014-12-29 DIAGNOSIS — F411 Generalized anxiety disorder: Secondary | ICD-10-CM | POA: Diagnosis not present

## 2014-12-29 LAB — POCT URINALYSIS DIPSTICK
GLUCOSE UA: NEGATIVE
Leukocytes, UA: NEGATIVE
Nitrite, UA: NEGATIVE
RBC UA: NEGATIVE
Spec Grav, UA: 1.025
Urobilinogen, UA: 1
pH, UA: 6

## 2014-12-29 MED ORDER — CLONAZEPAM 0.5 MG PO TABS: 0.2500 mg | ORAL_TABLET | Freq: Every day | ORAL | Status: DC | PRN

## 2014-12-29 MED ORDER — CIPROFLOXACIN HCL 500 MG PO TABS
500.0000 mg | ORAL_TABLET | Freq: Two times a day (BID) | ORAL | Status: DC
Start: 2014-12-29 — End: 2015-01-20

## 2014-12-29 NOTE — Progress Notes (Signed)
CC: Devin Carter is a 44 y.o. male is here for Hematuria   Subjective: HPI:  Complains of mild blood in urination usually at the end of urinating that is present with most voiding. It's been present for the last 2 or 3 days. It seems to be worse after having sex. He denies any blood in his ejaculate. He denies any dysuria or penile pain but does have some discomfort behind the scrotum. He denies any scrotal or testicular pain. He denies any discharge from the penis. Denies any recent or remote trauma to the scrotal or penile area. No interventions as of yet. Denies fevers, chills, flank pain or difficulty voiding  Also requests refill on clonazepam that he uses sparingly for anxiety. He denies any known side effects and believes it's working well   Review Of Systems Outlined In HPI  Past Medical History  Diagnosis Date  . GERD (gastroesophageal reflux disease)   . COPD (chronic obstructive pulmonary disease)   . Hx of pleurisy     No past surgical history on file. Family History  Problem Relation Age of Onset  . Heart failure Mother   . Hyperlipidemia Mother   . Diabetes Mother   . Heart failure Father     History   Social History  . Marital Status: Divorced    Spouse Name: N/A  . Number of Children: N/A  . Years of Education: N/A   Occupational History  . Not on file.   Social History Main Topics  . Smoking status: Current Every Day Smoker -- 1.50 packs/day for 30 years  . Smokeless tobacco: Not on file  . Alcohol Use: 0.0 oz/week    0 Standard drinks or equivalent per week     Comment: 1/2 gallon of liquor per wk  . Drug Use: No  . Sexual Activity:    Partners: Female   Other Topics Concern  . Not on file   Social History Narrative     Objective: BP 141/87 mmHg  Pulse 91  Temp(Src) 98.7 F (37.1 C) (Oral)  Wt 196 lb (88.905 kg)  General: Alert and Oriented, No Acute Distress HEENT: Pupils equal, round, reactive to light. Conjunctivae clear.  Moist mucous  membranes Lungs: clear comfortablework of breathing Cardiac: Regular rate and rhythm.  Extremities: No peripheral edema.  Strong peripheral pulses.  Mental Status: No depression, anxiety, nor agitation. Skin: Warm and dry.  Assessment & Plan: Devin Carter was seen today for hematuria.  Diagnoses and all orders for this visit:  Hematuria Orders: -     Urinalysis Dipstick -     ciprofloxacin (CIPRO) 500 MG tablet; Take 1 tablet (500 mg total) by mouth 2 (two) times daily. -     Urine culture  Generalized anxiety disorder Orders: -     clonazePAM (KLONOPIN) 0.5 MG tablet; Take 0.5-1 tablets (0.25-0.5 mg total) by mouth daily as needed for anxiety.   Hematuria: Checking a urine culture urinalysis is unremarkable. Most likely prostatitis that he has had in the past, hopefully bacterial therefore start Cipro. Anxiety: Controlled continue clonazepam  Return if symptoms worsen or fail to improve.

## 2014-12-31 LAB — URINE CULTURE
Colony Count: NO GROWTH
ORGANISM ID, BACTERIA: NO GROWTH

## 2015-01-20 ENCOUNTER — Ambulatory Visit (INDEPENDENT_AMBULATORY_CARE_PROVIDER_SITE_OTHER): Payer: 59 | Admitting: Family Medicine

## 2015-01-20 ENCOUNTER — Encounter: Payer: Self-pay | Admitting: Family Medicine

## 2015-01-20 VITALS — BP 143/87 | HR 79 | Wt 198.0 lb

## 2015-01-20 DIAGNOSIS — R319 Hematuria, unspecified: Secondary | ICD-10-CM | POA: Diagnosis not present

## 2015-01-20 DIAGNOSIS — N419 Inflammatory disease of prostate, unspecified: Secondary | ICD-10-CM | POA: Diagnosis not present

## 2015-01-20 DIAGNOSIS — N411 Chronic prostatitis: Secondary | ICD-10-CM | POA: Diagnosis not present

## 2015-01-20 MED ORDER — CIPROFLOXACIN HCL 500 MG PO TABS: 500.0000 mg | ORAL_TABLET | Freq: Two times a day (BID) | ORAL | Status: DC

## 2015-01-20 NOTE — Progress Notes (Signed)
Devin Carter is a 44 y.o. male who presents to [facility]  today for prostatitis. Patient was recently treated with 2 weeks of Cipro for prostatitis. This will well until his symptoms returned after he stopped taking them. In the past has required longer courses of Cipro to treat prostatitis. He notes burning and urinary frequency and mild pelvic soreness. No penile discharge vomiting or diarrhea.   Past Medical History  Diagnosis Date  . GERD (gastroesophageal reflux disease)   . COPD (chronic obstructive pulmonary disease)   . Hx of pleurisy    No past surgical history on file. Social History  Substance Use Topics  . Smoking status: Current Every Day Smoker -- 1.50 packs/day for 30 years  . Smokeless tobacco: Not on file  . Alcohol Use: 0.0 oz/week    0 Standard drinks or equivalent per week     Comment: 1/2 gallon of liquor per wk   ROS as above Medications: Current Outpatient Prescriptions  Medication Sig Dispense Refill  . albuterol (PROVENTIL HFA;VENTOLIN HFA) 108 (90 BASE) MCG/ACT inhaler Inhale 2 puffs into the lungs every 6 (six) hours as needed for wheezing or shortness of breath. 3.7 g 2  . Albuterol Sulfate (PROAIR RESPICLICK) 108 (90 BASE) MCG/ACT AEPB Inhale 2 puffs into the lungs every 6 (six) hours as needed (wheezing or shortness of breath.). 1 each 5  . ciprofloxacin (CIPRO) 500 MG tablet Take 1 tablet (500 mg total) by mouth 2 (two) times daily. 60 tablet 0  . clonazePAM (KLONOPIN) 0.5 MG tablet Take 0.5-1 tablets (0.25-0.5 mg total) by mouth daily as needed for anxiety. 20 tablet 1  . omeprazole (PRILOSEC) 20 MG capsule Take 20 mg by mouth daily.     No current facility-administered medications for this visit.   Allergies  Allergen Reactions  . Varenicline Anaphylaxis and Swelling  . Varenicline Tartrate Anaphylaxis  . Advair Diskus [Fluticasone-Salmeterol]     Mouth sores  . Symbicort [Budesonide-Formoterol Fumarate]    Unknown, outside records     Exam:  BP 143/87 mmHg  Pulse 79  Wt 198 lb (89.812 kg) Gen: Well NAD HEENT: EOMI,  MMM Lungs: Normal work of breathing. CTABL Heart: RRR no MRG Abd: NABS, Soft. Nondistended, Nontender Exts: Brisk capillary refill, warm and well perfused.   No results found for this or any previous visit (from the past 24 hour(s)). No results found.   Please see individual assessment and plan sections.

## 2015-01-20 NOTE — Assessment & Plan Note (Addendum)
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 Urine culture pending. GC chlamydia the urine also pending. Empiric treatment with one month of Cipro. If not better follow-up with urology. Chronic problem worsened

## 2015-01-20 NOTE — Patient Instructions (Signed)
Thank you for coming in today. Restart cipro.  Return if not better.  If your belly pain worsens, or you have high fever, bad vomiting, blood in your stool or black tarry stool go to the Emergency Room.    Prostatitis The prostate gland is about the size and shape of a walnut. It is located just below your bladder. It produces one of the components of semen, which is made up of sperm and the fluids that help nourish and transport it out from the testicles. Prostatitis is inflammation of the prostate gland.  There are four types of prostatitis:  Acute bacterial prostatitis. This is the least common type of prostatitis. It starts quickly and usually is associated with a bladder infection, high fever, and shaking chills. It can occur at any age.  Chronic bacterial prostatitis. This is a persistent bacterial infection in the prostate. It usually develops from repeated acute bacterial prostatitis or acute bacterial prostatitis that was not properly treated. It can occur in men of any age but is most common in middle-aged men whose prostate has begun to enlarge. The symptoms are not as severe as those in acute bacterial prostatitis. Discomfort in the part of your body that is in front of your rectum and below your scrotum (perineum), lower abdomen, or in the head of your penis (glans) may represent your primary discomfort.  Chronic prostatitis (nonbacterial). This is the most common type of prostatitis. It is inflammation of the prostate gland that is not caused by a bacterial infection. The cause is unknown and may be associated with a viral infection or autoimmune disorder.  Prostatodynia (pelvic floor disorder). This is associated with increased muscular tone in the pelvis surrounding the prostate. CAUSES The causes of bacterial prostatitis are bacterial infection. The causes of the other types of prostatitis are unknown.  SYMPTOMS  Symptoms can vary depending upon the type of prostatitis that  exists. There can also be overlap in symptoms. Possible symptoms for each type of prostatitis are listed below. Acute Bacterial Prostatitis  Painful urination.  Fever or chills.  Muscle or joint pains.  Low back pain.  Low abdominal pain.  Inability to empty bladder completely. Chronic Bacterial Prostatitis, Chronic Nonbacterial Prostatitis, and Prostatodynia  Sudden urge to urinate.  Frequent urination.  Difficulty starting urine stream.  Weak urine stream.  Discharge from the urethra.  Dribbling after urination.  Rectal pain.  Pain in the testicles, penis, or tip of the penis.  Pain in the perineum.  Problems with sexual function.  Painful ejaculation.  Bloody semen. DIAGNOSIS  In order to diagnose prostatitis, your health care provider will ask about your symptoms. One or more urine samples will be taken and tested (urinalysis). If the urinalysis result is negative for bacteria, your health care provider may use a finger to feel your prostate (digital rectal exam). This exam helps your health care provider determine if your prostate is swollen and tender. It will also produce a specimen of semen that can be analyzed. TREATMENT  Treatment for prostatitis depends on the cause. If a bacterial infection is the cause, it can be treated with antibiotic medicine. In cases of chronic bacterial prostatitis, the use of antibiotics for up to 1 month or 6 weeks may be necessary. Your health care provider may instruct you to take sitz baths to help relieve pain. A sitz bath is a bath of hot water in which your hips and buttocks are under water. This relaxes the pelvic floor muscles and often  helps to relieve the pressure on your prostate. HOME CARE INSTRUCTIONS   Take all medicines as directed by your health care provider.  Take sitz baths as directed by your health care provider. SEEK MEDICAL CARE IF:   Your symptoms get worse, not better.  You have a fever. SEEK IMMEDIATE  MEDICAL CARE IF:   You have chills.  You feel nauseous or vomit.  You feel lightheaded or faint.  You are unable to urinate.  You have blood or blood clots in your urine. MAKE SURE YOU:  Understand these instructions.  Will watch your condition.  Will get help right away if you are not doing well or get worse. Document Released: 05/20/2000 Document Revised: 05/28/2013 Document Reviewed: 12/10/2012 [facility] Patient Information 2015 [place], [place]. This information is not intended to replace advice given to you by your health care provider. Make sure you discuss any questions you have with your health care provider.

## 2015-01-21 LAB — GC/CHLAMYDIA PROBE AMP, URINE
Chlamydia, Swab/Urine, PCR: NEGATIVE
GC PROBE AMP, URINE: NEGATIVE

## 2015-01-22 LAB — URINE CULTURE
Colony Count: NO GROWTH
Organism ID, Bacteria: NO GROWTH

## 2015-01-30 ENCOUNTER — Ambulatory Visit (INDEPENDENT_AMBULATORY_CARE_PROVIDER_SITE_OTHER): Payer: 59

## 2015-01-30 ENCOUNTER — Encounter: Payer: Self-pay | Admitting: Family Medicine

## 2015-01-30 ENCOUNTER — Ambulatory Visit (INDEPENDENT_AMBULATORY_CARE_PROVIDER_SITE_OTHER): Payer: 59 | Admitting: Family Medicine

## 2015-01-30 VITALS — BP 139/95 | HR 82 | Wt 194.0 lb

## 2015-01-30 DIAGNOSIS — R079 Chest pain, unspecified: Secondary | ICD-10-CM

## 2015-01-30 DIAGNOSIS — R0789 Other chest pain: Secondary | ICD-10-CM

## 2015-01-30 DIAGNOSIS — R222 Localized swelling, mass and lump, trunk: Secondary | ICD-10-CM

## 2015-01-30 NOTE — Progress Notes (Signed)
CC: Devin Carter is a 44 y.o. male is here for lump on chest   Subjective: HPI:   Swelling in the middle of the chest hasn't present for a few years, since he's been losing weight and is becoming more prominent. It's also causing him some pain. It is particularly painful when doing pushups or bench press. No interventions as of yet. He was told by a former physician that it was a lipoma and to ignore it. Pain is described only sharp. It's nonradiating. He denies any recent or remote trauma. Denies any cough, shortness of breath, wheezing, nor chest pain elsewhere. Denies any nausea vomiting or diarrhea   Review Of Systems Outlined In HPI  Past Medical History  Diagnosis Date  . GERD (gastroesophageal reflux disease)   . COPD (chronic obstructive pulmonary disease)   . Hx of pleurisy     No past surgical history on file. Family History  Problem Relation Age of Onset  . Heart failure Mother   . Hyperlipidemia Mother   . Diabetes Mother   . Heart failure Father     Social History   Social History  . Marital Status: Divorced    Spouse Name: N/A  . Number of Children: N/A  . Years of Education: N/A   Occupational History  . Not on file.   Social History Main Topics  . Smoking status: Current Every Day Smoker -- 1.50 packs/day for 30 years  . Smokeless tobacco: Not on file  . Alcohol Use: 0.0 oz/week    0 Standard drinks or equivalent per week     Comment: 1/2 gallon of liquor per wk  . Drug Use: No  . Sexual Activity:    Partners: Female   Other Topics Concern  . Not on file   Social History Narrative     Objective: BP 139/95 mmHg  Pulse 82  Wt 194 lb (87.998 kg)  General: Alert and Oriented, No Acute Distress HEENT: Pupils equal, round, reactive to light. Conjunctivae clear.  Moist mucous membranes Lungs: Clear to auscultation bilaterally, no wheezing/ronchi/rales.  Comfortable work of breathing. Good air movement. Cardiac: Regular rate and rhythm. Normal S1/S2.   No murmurs, rubs, nor gallops.   Chest: Just below the sternum at what I expect is the xiphoid process is a firm tender mass approximately 2 cm in diameter which is not mobile. Abdomen: Soft nontender no palpable masses. Extremities: No peripheral edema.  Strong peripheral pulses.  Mental Status: No depression, anxiety, nor agitation. Skin: Warm and dry.  Assessment & Plan: Brady was seen today for lump on chest.  Diagnoses and all orders for this visit:  Sternal pain -     DG Sternum; Future   Sternal pain: Low suspicion for hernia. Obtaining x-rays to rule out bony mass on the xiphoid process versus possibility of lipoma. Ultimate plan will be based on the above results.  Return if symptoms worsen or fail to improve.

## 2015-02-02 ENCOUNTER — Telehealth: Payer: Self-pay | Admitting: Family Medicine

## 2015-02-02 MED ORDER — MELOXICAM 15 MG PO TABS: 15.0000 mg | ORAL_TABLET | Freq: Every day | ORAL | Status: DC

## 2015-02-02 NOTE — Telephone Encounter (Signed)
Sue Lush, Will you please let patient know that his xray would suggest that his mass on the chest is from his sternum bending forward.  I'd recommend starting a daily dose of meloxicam to see if this helps shrink the mass and if no better after three weeks call me for a referral to orthopedics. RX sent to [facility].

## 2015-02-02 NOTE — Telephone Encounter (Signed)
Pt.notified

## 2015-02-06 ENCOUNTER — Ambulatory Visit: Payer: 59 | Admitting: Family Medicine

## 2015-03-05 ENCOUNTER — Ambulatory Visit (INDEPENDENT_AMBULATORY_CARE_PROVIDER_SITE_OTHER): Payer: 59 | Admitting: Family Medicine

## 2015-03-05 ENCOUNTER — Encounter: Payer: Self-pay | Admitting: Family Medicine

## 2015-03-05 VITALS — BP 142/85 | HR 87 | Wt 196.0 lb

## 2015-03-05 DIAGNOSIS — F411 Generalized anxiety disorder: Secondary | ICD-10-CM

## 2015-03-05 DIAGNOSIS — A630 Anogenital (venereal) warts: Secondary | ICD-10-CM

## 2015-03-05 DIAGNOSIS — R079 Chest pain, unspecified: Secondary | ICD-10-CM | POA: Diagnosis not present

## 2015-03-05 DIAGNOSIS — R0789 Other chest pain: Secondary | ICD-10-CM

## 2015-03-05 MED ORDER — ALPRAZOLAM 1 MG PO TABS: 0.5000 mg | ORAL_TABLET | Freq: Two times a day (BID) | ORAL | Status: DC | PRN
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
## 2015-04-07 ENCOUNTER — Ambulatory Visit (INDEPENDENT_AMBULATORY_CARE_PROVIDER_SITE_OTHER): Payer: 59 | Admitting: Family Medicine

## 2015-04-07 ENCOUNTER — Encounter: Payer: Self-pay | Admitting: Family Medicine

## 2015-04-07 VITALS — BP 146/99 | HR 63 | Wt 197.0 lb

## 2015-04-07 DIAGNOSIS — F329 Major depressive disorder, single episode, unspecified: Secondary | ICD-10-CM | POA: Diagnosis not present

## 2015-04-07 DIAGNOSIS — F411 Generalized anxiety disorder: Secondary | ICD-10-CM

## 2015-04-07 DIAGNOSIS — F32A Depression, unspecified: Secondary | ICD-10-CM

## 2015-04-07 MED ORDER — ALPRAZOLAM 1 MG PO TABS: 0.5000 mg | ORAL_TABLET | Freq: Two times a day (BID) | ORAL | Status: DC | PRN

## 2015-04-07 MED ORDER — ESCITALOPRAM OXALATE 10 MG PO TABS: 10.0000 mg | ORAL_TABLET | Freq: Every day | ORAL | Status: DC
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
## 2015-04-23 ENCOUNTER — Ambulatory Visit (INDEPENDENT_AMBULATORY_CARE_PROVIDER_SITE_OTHER): Payer: 59 | Admitting: Licensed Clinical Social Worker

## 2015-04-23 DIAGNOSIS — F411 Generalized anxiety disorder: Secondary | ICD-10-CM

## 2015-04-23 DIAGNOSIS — F4321 Adjustment disorder with depressed mood: Secondary | ICD-10-CM

## 2015-04-24 DIAGNOSIS — F4321 Adjustment disorder with depressed mood: Secondary | ICD-10-CM | POA: Insufficient documentation
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
## 2015-05-14 ENCOUNTER — Ambulatory Visit (HOSPITAL_COMMUNITY): Payer: 59 | Admitting: Licensed Clinical Social Worker

## 2015-06-02 ENCOUNTER — Other Ambulatory Visit: Payer: Self-pay | Admitting: Family Medicine

## 2015-06-10 ENCOUNTER — Ambulatory Visit (HOSPITAL_COMMUNITY): Payer: 59 | Admitting: Licensed Clinical Social Worker

## 2015-06-26 ENCOUNTER — Ambulatory Visit (HOSPITAL_COMMUNITY): Payer: 59 | Admitting: Licensed Clinical Social Worker

## 2015-07-13 ENCOUNTER — Ambulatory Visit (HOSPITAL_COMMUNITY): Payer: 59 | Admitting: Licensed Clinical Social Worker

## 2015-08-03 ENCOUNTER — Encounter: Payer: Self-pay | Admitting: Family Medicine

## 2015-08-03 ENCOUNTER — Ambulatory Visit (INDEPENDENT_AMBULATORY_CARE_PROVIDER_SITE_OTHER): Payer: 59 | Admitting: Family Medicine

## 2015-08-03 VITALS — BP 135/88 | HR 88 | Wt 195.0 lb

## 2015-08-03 DIAGNOSIS — R040 Epistaxis: Secondary | ICD-10-CM

## 2015-08-03 DIAGNOSIS — F411 Generalized anxiety disorder: Secondary | ICD-10-CM

## 2015-08-03 MED ORDER — ALPRAZOLAM 1 MG PO TABS: 0.5000 mg | ORAL_TABLET | Freq: Two times a day (BID) | ORAL | Status: DC | PRN

## 2015-08-03 MED ORDER — ESCITALOPRAM OXALATE 20 MG PO TABS: 20.0000 mg | ORAL_TABLET | Freq: Every day | ORAL | Status: DC

## 2015-08-03 NOTE — Progress Notes (Signed)
CC: Devin Carter is a 45 y.o. male is here for Medication Refill   Subjective: HPI:   Follow-up anxiety: Tolerating Lexapro, no known side effects. Still having anxiousness, restlessness and irritability for mild to moderate degree. Symptoms are worse by recent decline in mother's health and having to care for his father who suffers from dementia. Symptoms seem to improve with working out. Nothing else seems to make symptoms better or worse.denies any depression.  Had an episode of right-sided nasal congestion for 3 days with 2 episodes of brief bleeding from the right-side of his nose. Accompanied by  fevers, chills, sore throat. Symptoms resolved without any particular intervention.currently denies facial pain, nasal congestion, sore throat, cough, headache or rash.  Review Of Systems Outlined In HPI  Past Medical History  Diagnosis Date  . GERD (gastroesophageal reflux disease)   . COPD (chronic obstructive pulmonary disease) (HCC)   . Hx of pleurisy     No past surgical history on file. Family History  Problem Relation Age of Onset  . Heart failure Mother   . Hyperlipidemia Mother   . Diabetes Mother   . Heart failure Father     Social History   Social History  . Marital Status: Divorced    Spouse Name: N/A  . Number of Children: N/A  . Years of Education: N/A   Occupational History  . Not on file.   Social History Main Topics  . Smoking status: Current Every Day Smoker -- 1.50 packs/day for 30 years  . Smokeless tobacco: Not on file  . Alcohol Use: 0.0 oz/week    0 Standard drinks or equivalent per week     Comment: 1/2 gallon of liquor per wk  . Drug Use: No  . Sexual Activity:    Partners: Female   Other Topics Concern  . Not on file   Social History Narrative     Objective: BP 135/88 mmHg  Pulse 88  Wt 195 lb (88.451 kg)  General: Alert and Oriented, No Acute Distress HEENT: Pupils equal, round, reactive to light. Conjunctivae clear.  External ears  unremarkable, canals clear with intact TMs with appropriate landmarks.  Middle ear appears open without effusion. Pink inferior turbinates.  Moist mucous membranes, pharynx without inflammation nor lesions.  Neck supple without palpable lymphadenopathy nor abnormal masses. Lungs: clear and comfortable work of breathing Cardiac: Regular rate and rhythm.  Extremities: No peripheral edema.  Strong peripheral pulses.  Mental Status: mild anxiety. No depression nor agitation. Skin: Warm and dry.  Assessment & Plan: Devin Carter was seen today for medication refill.  Diagnoses and all orders for this visit:  Anxiety state -     ALPRAZolam (XANAX) 1 MG tablet; Take 0.5-1 tablets (0.5-1 mg total) by mouth 2 (two) times daily as needed for anxiety. -     escitalopram (LEXAPRO) 20 MG tablet; Take 1 tablet (20 mg total) by mouth daily.  Generalized anxiety disorder -     ALPRAZolam (XANAX) 1 MG tablet; Take 0.5-1 tablets (0.5-1 mg total) by mouth 2 (two) times daily as needed for anxiety. -     escitalopram (LEXAPRO) 20 MG tablet; Take 1 tablet (20 mg total) by mouth daily.  Epistaxis   Anxiety: Uncontrolled chronic condition increasing Lexapro, continue as needed use of Xanax. Epistaxis: Resolved, most likely due to minor upper respiratory illness which has resolved.   Return in about 3 months (around 10/31/2015) for mood.

## 2015-11-30 ENCOUNTER — Ambulatory Visit: Payer: Self-pay | Admitting: Sports Medicine

## 2015-11-30 ENCOUNTER — Encounter: Payer: Self-pay | Admitting: *Deleted

## 2015-11-30 ENCOUNTER — Emergency Department (INDEPENDENT_AMBULATORY_CARE_PROVIDER_SITE_OTHER)
Admission: EM | Admit: 2015-11-30 | Discharge: 2015-11-30 | Disposition: A | Discharge status: Home / Self Care | Payer: 59 | Source: Home / Self Care | Attending: Family Medicine | Admitting: Family Medicine

## 2015-11-30 DIAGNOSIS — S39012A Strain of muscle, fascia and tendon of lower back, initial encounter: Secondary | ICD-10-CM

## 2015-11-30 DIAGNOSIS — IMO0002 Reserved for concepts with insufficient information to code with codable children: Secondary | ICD-10-CM

## 2015-11-30 DIAGNOSIS — M62830 Muscle spasm of back: Secondary | ICD-10-CM | POA: Diagnosis not present

## 2015-11-30 DIAGNOSIS — T148 Other injury of unspecified body region: Secondary | ICD-10-CM

## 2015-11-30 MED ORDER — CYCLOBENZAPRINE HCL 10 MG PO TABS: 10.0000 mg | ORAL_TABLET | Freq: Two times a day (BID) | ORAL | Status: DC | PRN

## 2015-11-30 MED ORDER — MELOXICAM 7.5 MG PO TABS: 7.5000 mg | ORAL_TABLET | Freq: Every day | ORAL | Status: DC

## 2015-11-30 MED ORDER — HYDROCODONE-ACETAMINOPHEN 5-325 MG PO TABS: 1.0000 | ORAL_TABLET | Freq: Four times a day (QID) | ORAL | Status: DC | PRN

## 2015-11-30 MED ORDER — PREDNISONE 20 MG PO TABS: ORAL_TABLET | ORAL | Status: DC

## 2015-11-30 NOTE — ED Provider Notes (Signed)
CSN: [id number]     Arrival date & time 11/30/15  1222 History   First MD Initiated Contact with Patient 11/30/15 1237     Chief Complaint  Patient presents with  . Back Pain   (Consider location/radiation/quality/duration/timing/severity/associated sxs/prior Treatment) HPI Devin Carter is a 45 y.o. male presenting to UC with c/o gradually worsening mid-lower back pain for about 2 days, worse on Left side as he feels a muscle spasm. Pain is aching and sore but occasionally sharp with certain movements or certain positions. Denies pain or numbness into his arms or legs.  Pain is 4/10.  He did use OTC icy hot but now notes there is a small blister where he was using it. He has used in the past w/o reaction.  He notes he worked out a the gym doing "200" crunches the night before pain started as well as went on a date and was out all evening. Denies falls or known injury.  No prior back surgeries.    Past Medical History  Diagnosis Date  . GERD (gastroesophageal reflux disease)   . COPD (chronic obstructive pulmonary disease) (HCC)   . Hx of pleurisy    History reviewed. No pertinent past surgical history. Family History  Problem Relation Age of Onset  . Heart failure Mother   . Hyperlipidemia Mother   . Diabetes Mother   . Heart failure Father    Social History  Substance Use Topics  . Smoking status: Current Every Day Smoker -- 1.50 packs/day for 30 years  . Smokeless tobacco: None  . Alcohol Use: 0.0 oz/week    0 Standard drinks or equivalent per week     Comment: 1/2 gallon of liquor per wk    Review of Systems  Musculoskeletal: Positive for myalgias and back pain. Negative for arthralgias, gait problem, neck pain and neck stiffness.  Skin: Positive for wound ( blister). Negative for color change.  Neurological: Negative for weakness and numbness.    Allergies  Varenicline; Varenicline tartrate; Advair diskus; and Symbicort  Home Medications   Prior to Admission  medications   Medication Sig Start Date End Date Taking? Authorizing Provider  albuterol (PROVENTIL HFA;VENTOLIN HFA) 108 (90 BASE) MCG/ACT inhaler Inhale 2 puffs into the lungs every 6 (six) hours as needed for wheezing or shortness of breath. 08/21/14   Sean Hommel, DO  Albuterol Sulfate (PROAIR RESPICLICK) 108 (90 BASE) MCG/ACT AEPB Inhale 2 puffs into the lungs every 6 (six) hours as needed (wheezing or shortness of breath.). 08/21/14   Laren BoomSean Hommel, DO  ALPRAZolam (XANAX) 1 MG tablet Take 0.5-1 tablets (0.5-1 mg total) by mouth 2 (two) times daily as needed for anxiety. 08/03/15   Laren BoomSean Hommel, DO  cyclobenzaprine (FLEXERIL) 10 MG tablet Take 1 tablet (10 mg total) by mouth 2 (two) times daily as needed for muscle spasms. 11/30/15   Junius FinnerErin O'Malley, PA-C  escitalopram (LEXAPRO) 20 MG tablet Take 1 tablet (20 mg total) by mouth daily. 08/03/15   Laren BoomSean Hommel, DO  HYDROcodone-acetaminophen (NORCO/VICODIN) 5-325 MG tablet Take 1 tablet by mouth every 6 (six) hours as needed for moderate pain or severe pain. 11/30/15   Junius FinnerErin O'Malley, PA-C  meloxicam (MOBIC) 7.5 MG tablet Take 1 tablet (7.5 mg total) by mouth daily. 11/30/15   Junius FinnerErin O'Malley, PA-C  omeprazole (PRILOSEC) 20 MG capsule Take 20 mg by mouth daily.    Historical Provider, MD  predniSONE (DELTASONE) 20 MG tablet 3 tabs po day one, then 2 po daily x 4 days  11/30/15   Junius FinnerErin O'Malley, PA-C   Meds Ordered and Administered this Visit  Medications - No data to display  BP 116/73 mmHg  Pulse 103  Wt 189 lb (85.73 kg)  SpO2 97% No data found.   Physical Exam  Constitutional: He is oriented to person, place, and time. He appears well-developed and well-nourished.  HENT:  Head: Normocephalic and atraumatic.  Eyes: EOM are normal.  Neck: Normal range of motion.  Cardiovascular: Normal rate.   Pulmonary/Chest: Effort normal.  Musculoskeletal: Normal range of motion. He exhibits tenderness. He exhibits no edema.  No midline spinal tenderness.  Tenderness to Left and Right lower thoracic/upper lumbar paraspinal muscles. Full ROM upper and lower extremities with 5/5 strength.  Neurological: He is alert and oriented to person, place, and time.  Normal gait.   Skin: Skin is warm and dry. No erythema.  Right mid back, Left side: 2cm bullae c/w blister. No active bleeding or discharge.  Psychiatric: He has a normal mood and affect. His behavior is normal.  Nursing note and vitals reviewed.   ED Course  Procedures (including critical care time)  Labs Review Labs Reviewed - No data to display  Imaging Review No results found.    MDM   1. Low back strain, initial encounter   2. Spasm of back muscles   3. Blister of skin without infection    Pt c/o mid-lower back pain following a day at the gym and being out "all evening" on a date the day before back pain started. Palpable muscle spasm. No red flag symptoms. No known injury.  Pain likely muscular in nature given recent workout. Will hold off on imaging at this time.  Rx: norco (tramadol could interact with his escitalopram), flexeril, prednisone, and meloxicam.  Encouraged alternating warm and cool compresses.   F/u with Sports Medicine in 1 week if not improving, sooner if worsening.    Junius Finnerrin O'Malley, PA-C 11/30/15 1527

## 2015-11-30 NOTE — ED Notes (Signed)
Pt c/o mid-low back pain x 2 days, he awoke with it Saturday morning. He had worked out the day before and "went on a date all evening" the day before. Used icy hot otc, he now has a blister @ the site of icy hot use.

## 2015-11-30 NOTE — Discharge Instructions (Signed)
Norco/Vicodin (hydrocodone-acetaminophen) is a narcotic pain medication, do not combine these medications with others containing tylenol. While taking, do not drink alcohol, drive, or perform any other activities that requires focus while taking these medications.   You have been prescribed 5 days of prednisone, an oral steroid to help with inflammation and itching.  You may start this medication tomorrow with breakfast as it can make it difficult to sleep if taken at night.  Meloxicam (Mobic) is an antiinflammatory to help with pain and inflammation.  Do not take ibuprofen, Advil, Aleve, or any other medications that contain NSAIDs while taking meloxicam as this may cause stomach upset or even ulcers if taken in large amounts for an extended period of time.

## 2016-02-11 ENCOUNTER — Ambulatory Visit (INDEPENDENT_AMBULATORY_CARE_PROVIDER_SITE_OTHER): Payer: 59 | Admitting: Family Medicine

## 2016-02-11 VITALS — BP 125/86 | HR 90 | Wt 189.0 lb

## 2016-02-11 DIAGNOSIS — F411 Generalized anxiety disorder: Secondary | ICD-10-CM | POA: Diagnosis not present

## 2016-02-11 DIAGNOSIS — K219 Gastro-esophageal reflux disease without esophagitis: Secondary | ICD-10-CM

## 2016-02-11 DIAGNOSIS — N411 Chronic prostatitis: Secondary | ICD-10-CM | POA: Diagnosis not present

## 2016-02-11 DIAGNOSIS — E785 Hyperlipidemia, unspecified: Secondary | ICD-10-CM | POA: Diagnosis not present

## 2016-02-11 DIAGNOSIS — J42 Unspecified chronic bronchitis: Secondary | ICD-10-CM

## 2016-02-11 MED ORDER — OMEPRAZOLE 20 MG PO CPDR: 20.0000 mg | DELAYED_RELEASE_CAPSULE | Freq: Every day | ORAL | 3 refills | Status: DC

## 2016-02-11 MED ORDER — ALBUTEROL SULFATE HFA 108 (90 BASE) MCG/ACT IN AERS
2.0000 | INHALATION_SPRAY | Freq: Four times a day (QID) | RESPIRATORY_TRACT | 2 refills | Status: AC | PRN
Start: 2016-02-11 — End: ?

## 2016-02-11 MED ORDER — CIPROFLOXACIN HCL 500 MG PO TABS: 500.0000 mg | ORAL_TABLET | Freq: Two times a day (BID) | ORAL | 0 refills | Status: DC

## 2016-02-11 MED ORDER — ALPRAZOLAM 1 MG PO TABS: 0.5000 mg | ORAL_TABLET | Freq: Two times a day (BID) | ORAL | 2 refills | Status: AC | PRN

## 2016-02-11 NOTE — Progress Notes (Signed)
Devin Carter is a 45 y.o. male who presents to [facility]: Primary [facility] Sports Medicine today for dysuria and to establish care.  Patient endorses a 1 month history of dysuria.  Four days ago he noticed blood from his penis after having sexual intercourse with a male partner.  He woke up the next morning and passed a clot, but hasn't noticed hematuria otherwise. He denies fever, chills, increased frequency, and nocturia.  No penile discharge, abdominal pain, or flank pain.   Patient claims his symptoms feel identical to his previous episodes of prostatitis.  No other complaints.    Past Medical History:  Diagnosis Date  . COPD (chronic obstructive pulmonary disease) (HCC)   . GERD (gastroesophageal reflux disease)   . Hx of pleurisy    No past surgical history on file. Social History  Substance Use Topics  . Smoking status: Current Every Day Smoker    Packs/day: 1.50    Years: 30.00  . Smokeless tobacco: Not on file  . Alcohol use 0.0 oz/week     Comment: 1/2 gallon of liquor per wk   family history includes Diabetes in his mother; Heart failure in his father and mother; Hyperlipidemia in his mother.  ROS as above: No headache, visual changes, nausea, vomiting, diarrhea, constipation, dizziness, abdominal pain, skin rash, fevers, chills, night sweats, weight loss, swollen lymph nodes, body aches, joint swelling, muscle aches, chest pain, shortness of breath, mood changes, visual or auditory hallucinations.   Medications: Current Outpatient Prescriptions  Medication Sig Dispense Refill  . albuterol (PROVENTIL HFA;VENTOLIN HFA) 108 (90 Base) MCG/ACT inhaler Inhale 2 puffs into the lungs every 6 (six) hours as needed for wheezing or shortness of breath. 3.7 g 2  . ALPRAZolam (XANAX) 1 MG tablet Take 0.5-1 tablets (0.5-1 mg total) by mouth 2 (two) times daily as needed for anxiety. 30 tablet 2  .  omeprazole (PRILOSEC) 20 MG capsule Take 1 capsule (20 mg total) by mouth daily. 90 capsule 3  . ciprofloxacin (CIPRO) 500 MG tablet Take 1 tablet (500 mg total) by mouth 2 (two) times daily. 60 tablet 0   No current facility-administered medications for this visit.    Allergies  Allergen Reactions  . Varenicline Anaphylaxis and Swelling  . Varenicline Tartrate Anaphylaxis  . Advair Diskus [Fluticasone-Salmeterol]     Mouth sores  . Symbicort [Budesonide-Formoterol Fumarate]     Unknown, outside records     Exam:  BP 125/86   Pulse 90   Wt 189 lb (85.7 kg)   BMI 24.94 kg/m  Gen: Well NAD, nontoxic appearing HEENT: EOMI,  MMM Lungs: Normal work of breathing. CTABL Heart: RRR no MRG Abd: NABS, Soft. Nondistended, Nontender Exts: Brisk capillary refill, warm and well perfused.  Rectal: Moderately enlarged, nontender, symmetric, boggy prostate without nodules.   Assessment and Plan: 45 y.o. male with:  1.  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  His symptoms could also be from urethritis, but this would be less likely without penile discharge.   - Cipro 500 mg BID - Urine culture with GC probe - PSA  2.  Establish care:  Overall, he is doing well.  Will get general labs (CBC, CMP, A1c, lipid panel, and Vitamin D) - Refill his Xanax, prilosec, and albuterol     Orders Placed This Encounter  Procedures  . Urine culture  . CBC  . Comprehensive metabolic panel    Order Specific Question:  Has the patient fasted?    Answer:   No  . Hemoglobin A1c  . TSH  . VITAMIN D 25 Hydroxy (Vit-D Deficiency, Fractures)  . Lipid panel    Order Specific Question:   Has the patient fasted?    Answer:   No  . GC Probe amplification, urine  . PSA    Discussed warning signs or symptoms. Please see discharge instructions. Patient expresses understanding.

## 2016-02-11 NOTE — Patient Instructions (Signed)
Thank you for coming in today. Get blood work in the next few days fasting.  Return in a few months.  Start cipro today.    Prostatitis The prostate gland is about the size and shape of a walnut. It is located just below your bladder. It produces one of the components of semen, which is made up of sperm and the fluids that help nourish and transport it out from the testicles. Prostatitis is inflammation of the prostate gland.  There are four types of prostatitis:  Acute bacterial prostatitis. This is the least common type of prostatitis. It starts quickly and usually is associated with a bladder infection, high fever, and shaking chills. It can occur at any age.  Chronic bacterial prostatitis. This is a persistent bacterial infection in the prostate. It usually develops from repeated acute bacterial prostatitis or acute bacterial prostatitis that was not properly treated. It can occur in men of any age but is most common in middle-aged men whose prostate has begun to enlarge. The symptoms are not as severe as those in acute bacterial prostatitis. Discomfort in the part of your body that is in front of your rectum and below your scrotum (perineum), lower abdomen, or in the head of your penis (glans) may represent your primary discomfort.  Chronic prostatitis (nonbacterial). This is the most common type of prostatitis. It is inflammation of the prostate gland that is not caused by a bacterial infection. The cause is unknown and may be associated with a viral infection or autoimmune disorder.  Prostatodynia (pelvic floor disorder). This is associated with increased muscular tone in the pelvis surrounding the prostate. CAUSES The causes of bacterial prostatitis are bacterial infection. The causes of the other types of prostatitis are unknown.  SYMPTOMS  Symptoms can vary depending upon the type of prostatitis that exists. There can also be overlap in symptoms. Possible symptoms for each type of  prostatitis are listed below. Acute Bacterial Prostatitis  Painful urination.  Fever or chills.  Muscle or joint pains.  Low back pain.  Low abdominal pain.  Inability to empty bladder completely. Chronic Bacterial Prostatitis, Chronic Nonbacterial Prostatitis, and Prostatodynia  Sudden urge to urinate.  Frequent urination.  Difficulty starting urine stream.  Weak urine stream.  Discharge from the urethra.  Dribbling after urination.  Rectal pain.  Pain in the testicles, penis, or tip of the penis.  Pain in the perineum.  Problems with sexual function.  Painful ejaculation.  Bloody semen. DIAGNOSIS  In order to diagnose prostatitis, your health care provider will ask about your symptoms. One or more urine samples will be taken and tested (urinalysis). If the urinalysis result is negative for bacteria, your health care provider may use a finger to feel your prostate (digital rectal exam). This exam helps your health care provider determine if your prostate is swollen and tender. It will also produce a specimen of semen that can be analyzed. TREATMENT  Treatment for prostatitis depends on the cause. If a bacterial infection is the cause, it can be treated with antibiotic medicine. In cases of chronic bacterial prostatitis, the use of antibiotics for up to 1 month or 6 weeks may be necessary. Your health care provider may instruct you to take sitz baths to help relieve pain. A sitz bath is a bath of hot water in which your hips and buttocks are under water. This relaxes the pelvic floor muscles and often helps to relieve the pressure on your prostate. HOME CARE INSTRUCTIONS   Take  all medicines as directed by your health care provider.  Take sitz baths as directed by your health care provider. SEEK MEDICAL CARE IF:   Your symptoms get worse, not better.  You have a fever. SEEK IMMEDIATE MEDICAL CARE IF:   You have chills.  You feel nauseous or vomit.  You feel  lightheaded or faint.  You are unable to urinate.  You have blood or blood clots in your urine. MAKE SURE YOU:  Understand these instructions.  Will watch your condition.  Will get help right away if you are not doing well or get worse.   This information is not intended to replace advice given to you by your health care provider. Make sure you discuss any questions you have with your health care provider.   Document Released: 05/20/2000 Document Revised: 06/13/2014 Document Reviewed: 12/10/2012 Elsevier Interactive Patient Education [employer]2016 Elsevier Inc.

## 2016-02-12 LAB — COMPREHENSIVE METABOLIC PANEL

## 2016-02-12 LAB — LIPID PANEL

## 2016-02-12 LAB — CBC

## 2016-02-12 LAB — PSA

## 2016-02-12 LAB — HEMOGLOBIN A1C

## 2016-02-12 LAB — TSH

## 2016-02-12 LAB — VITAMIN D 25 HYDROXY (VIT D DEFICIENCY, FRACTURES)

## 2016-02-13 LAB — URINE CULTURE: ORGANISM ID, BACTERIA: NO GROWTH

## 2016-02-17 LAB — NEISSERIA GONORRHOEAE, PROBE AMP

## 2016-04-19 ENCOUNTER — Ambulatory Visit (INDEPENDENT_AMBULATORY_CARE_PROVIDER_SITE_OTHER): Payer: 59 | Admitting: Family Medicine

## 2016-04-19 VITALS — BP 130/84 | HR 80 | Temp 99.1°F | Wt 199.0 lb

## 2016-04-19 DIAGNOSIS — J441 Chronic obstructive pulmonary disease with (acute) exacerbation: Secondary | ICD-10-CM

## 2016-04-19 MED ORDER — PREDNISONE 50 MG PO TABS: 50.0000 mg | ORAL_TABLET | Freq: Every day | ORAL | 0 refills | Status: DC

## 2016-04-19 MED ORDER — AZITHROMYCIN 250 MG PO TABS: 250.0000 mg | ORAL_TABLET | Freq: Every day | ORAL | 0 refills | Status: DC

## 2016-04-19 MED ORDER — GUAIFENESIN-CODEINE 100-10 MG/5ML PO SOLN: 5.0000 mL | Freq: Every evening | ORAL | 0 refills | Status: AC | PRN

## 2016-04-19 NOTE — Patient Instructions (Signed)
Thank you for coming in today. Call or go to the emergency room if you get worse, have trouble breathing, have chest pains, or palpitations.  Continue albuterol.  Use cough medicine as needed,.  Take prednisone daily.  Use azithromycin antibiotic.    Chronic Obstructive Pulmonary Disease Exacerbation Chronic obstructive pulmonary disease (COPD) is a common lung condition in which airflow from the lungs is limited. COPD is a general term that can be used to describe many different lung problems that limit airflow, including chronic bronchitis and emphysema. COPD exacerbations are episodes when breathing symptoms become much worse and require extra treatment. Without treatment, COPD exacerbations can be life threatening, and frequent COPD exacerbations can cause further damage to your lungs. What are the causes?  Respiratory infections.  Exposure to smoke.  Exposure to air pollution, chemical fumes, or dust. Sometimes there is no apparent cause or trigger. What increases the risk?  Smoking cigarettes.  Older age.  Frequent prior COPD exacerbations. What are the signs or symptoms?  Increased coughing.  Increased thick spit (sputum) production.  Increased wheezing.  Increased shortness of breath.  Rapid breathing.  Chest tightness. How is this diagnosed? Your medical history, a physical exam, and tests will help your health care provider make a diagnosis. Tests may include:  A chest X-ray.  Basic lab tests.  Sputum testing.  An arterial blood gas test. How is this treated? Depending on the severity of your COPD exacerbation, you may need to be admitted to a hospital for treatment. Some of the treatments commonly used to treat COPD exacerbations are:  Antibiotic medicines.  Bronchodilators. These are drugs that expand the air passages. They may be given with an inhaler or nebulizer. Spacer devices may be needed to help improve drug delivery.  Corticosteroid  medicines.  Supplemental oxygen therapy.  Airway clearing techniques, such as noninvasive ventilation (NIV) and positive expiratory pressure (PEP). These provide respiratory support through a mask or other noninvasive device. Follow these instructions at home:  Do not smoke. Quitting smoking is very important to prevent COPD from getting worse and exacerbations from happening as often.  Avoid exposure to all substances that irritate the airway, especially to tobacco smoke.  If you were prescribed an antibiotic medicine, finish it all even if you start to feel better.  Take all medicines as directed by your health care provider.It is important to use correct technique with inhaled medicines.  Drink enough fluids to keep your urine clear or pale yellow (unless you have a medical condition that requires fluid restriction).  Use a cool mist vaporizer. This makes it easier to clear your chest when you cough.  If you have a home nebulizer and oxygen, continue to use them as directed.  Maintain all necessary vaccinations to prevent infections.  Exercise regularly.  Eat a healthy diet.  Keep all follow-up appointments as directed by your health care provider. Get help right away if:  You have worsening shortness of breath.  You have trouble talking.  You have severe chest pain.  You have blood in your sputum.  You have a fever.  You have weakness, vomit repeatedly, or faint.  You feel confused.  You continue to get worse. This information is not intended to replace advice given to you by your health care provider. Make sure you discuss any questions you have with your health care provider. Document Released: 03/20/2007 Document Revised: 10/29/2015 Document Reviewed: 01/25/2013 Elsevier Interactive Patient Education  2017 [employer]

## 2016-04-19 NOTE — Progress Notes (Signed)
       Devin GillisVictor Carter is a 45 y.o. male who [facility] today for cough congestion wheezing shortness of breath. Symptoms present for several days. Patient has used his albuterol inhaler which helps temporarily. He additionally has used some over-the-counter medications which do not help. His symptoms are consistent with prior episodes of bronchitis. No vomiting fevers or chills.   Past Medical History:  Diagnosis Date  . COPD (chronic obstructive pulmonary disease) (HCC)   . GERD (gastroesophageal reflux disease)   . Hx of pleurisy    No past surgical history on file. Social History  Substance Use Topics  . Smoking status: Current Every Day Smoker    Packs/day: 1.50    Years: 30.00  . Smokeless tobacco: Not on file  . Alcohol use 0.0 oz/week     Comment: 1/2 gallon of liquor per wk   family history includes Diabetes in his mother; Heart failure in his father and mother; Hyperlipidemia in his mother.  ROS as above:  Medications: Current Outpatient Prescriptions  Medication Sig Dispense Refill  . albuterol (PROVENTIL HFA;VENTOLIN HFA) 108 (90 Base) MCG/ACT inhaler Inhale 2 puffs into the lungs every 6 (six) hours as needed for wheezing or shortness of breath. 3.7 g 2  . ALPRAZolam (XANAX) 1 MG tablet Take 0.5-1 tablets (0.5-1 mg total) by mouth 2 (two) times daily as needed for anxiety. 30 tablet 2  . omeprazole (PRILOSEC) 20 MG capsule Take 1 capsule (20 mg total) by mouth daily. 90 capsule 3  . azithromycin (ZITHROMAX) 250 MG tablet Take 1 tablet (250 mg total) by mouth daily. Take first 2 tablets together, then 1 every day until finished. 6 tablet 0  . guaiFENesin-codeine 100-10 MG/5ML syrup Take 5 mLs by mouth at bedtime as needed for cough. 120 mL 0  . predniSONE (DELTASONE) 50 MG tablet Take 1 tablet (50 mg total) by mouth daily. 5 tablet 0   No current  facility-administered medications for this visit.    Allergies  Allergen Reactions  . Varenicline Anaphylaxis and Swelling  . Varenicline Tartrate Anaphylaxis  . Advair Diskus [Fluticasone-Salmeterol]     Mouth sores  . Symbicort [Budesonide-Formoterol Fumarate]     Unknown, outside records    Health Maintenance Health Maintenance  Topic Date Due  . INFLUENZA VACCINE  01/05/2016  . TETANUS/TDAP  04/06/2018  . HIV Screening  Completed     Exam:  BP 130/84   Pulse 80   Temp 99.1 F (37.3 C) (Oral)   Wt 199 lb (90.3 kg)   BMI 26.25 kg/m  Gen: Well NAD Nontoxic appearing HEENT: EOMI,  MMM or nasal discharge Lungs: Normal work of breathing. Prolonged expiratory phase and wheezing present bilaterally Heart: RRR no MRG Abd: NABS, Soft. Nondistended, Nontender Exts: Brisk capillary refill, warm and well perfused.    No results found for this or any previous visit (from the past 72 hour(s)). No results found.    Assessment and Plan: 45 y.o. male with COPD exacerbation. Treatment with prednisone and azithromycin antibiotics and codeine cough syrup. Continue albuterol as needed. Recommend smoking cessation. Follow-up with PCP in the near future.   No orders of the defined types were placed in this encounter.   Discussed warning signs or symptoms. Please see discharge instructions. Patient expresses understanding.

## 2016-04-29 ENCOUNTER — Emergency Department (INDEPENDENT_AMBULATORY_CARE_PROVIDER_SITE_OTHER)
Admission: EM | Admit: 2016-04-29 | Discharge: 2016-04-29 | Disposition: A | Discharge status: Home / Self Care | Payer: 59 | Source: Home / Self Care | Attending: Family Medicine | Admitting: Family Medicine

## 2016-04-29 DIAGNOSIS — N419 Inflammatory disease of prostate, unspecified: Secondary | ICD-10-CM

## 2016-04-29 MED ORDER — LEVOFLOXACIN 500 MG PO TABS: 500.0000 mg | ORAL_TABLET | Freq: Every day | ORAL | 0 refills | Status: AC

## 2016-04-29 NOTE — Discharge Instructions (Signed)
Increase fluid intake.  May take Ibuprofen 200mg, 4 tabs every 8 hours with food.  °

## 2016-04-29 NOTE — ED Triage Notes (Signed)
Patient states that he thinks he has a prostate infection, patient states that this is a chronic issue. Burning / Urgency, patient declined to give urine sample

## 2016-04-29 NOTE — ED Provider Notes (Signed)
[facility]    CSN: [id number] Arrival date & time: 04/29/16  1246     History   Chief Complaint Chief Complaint  Patient presents with  . Dysuria    HPI Devin Carter is a 45 y.o. male.   Patient complains of urgency, dysuria, and hematuria for one week.  He has a history of recurrent prostatitis.  No fevers, chills, and sweats.  He feels well otherwise.   The history is provided by the patient.  Dysuria  This is a recurrent problem. Episode onset: 1 week ago. The problem occurs constantly. The problem has been gradually worsening. Pertinent negatives include no abdominal pain. Exacerbated by: urination. Nothing relieves the symptoms. He has tried nothing for the symptoms.    Past Medical History:  Diagnosis Date  . COPD (chronic obstructive pulmonary disease) (HCC)   . GERD (gastroesophageal reflux disease)   . Hx of pleurisy     Patient Active Problem List   Diagnosis Date Noted  . Adjustment disorder with depressed mood 04/24/2015  . Prostatitis 01/20/2015  . 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 COPD (chronic obstructive pulmonary disease) (HCC) 09/11/2014  . GERD (gastroesophageal reflux disease) 09/11/2014  . Hyperlipidemia 09/11/2014  . Generalized anxiety disorder 08/21/2014  . 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 No pertinent surgical history.     Home Medications    Prior to Admission medications   Medication Sig Start Date End Date Taking? Authorizing Provider  albuterol (PROVENTIL HFA;VENTOLIN HFA) 108 (90 Base) MCG/ACT inhaler Inhale 2 puffs into the lungs every 6 (six) hours as needed for wheezing or shortness of breath. 02/11/16  Yes Rodolph BongEvan S Corey, MD  ALPRAZolam Prudy Feeler(XANAX) 1 MG tablet Take 0.5-1 tablets (0.5-1 mg total) by mouth 2 (two) times daily as needed for anxiety. 02/11/16  Yes Rodolph BongEvan S Corey, MD  guaiFENesin-codeine 100-10 MG/5ML syrup Take 5 mLs by mouth at bedtime as needed for cough. 04/19/16  Yes Rodolph BongEvan S  Corey, MD  omeprazole (PRILOSEC) 20 MG capsule Take 1 capsule (20 mg total) by mouth daily. 02/11/16  Yes Rodolph BongEvan S Corey, MD  predniSONE (DELTASONE) 50 MG tablet Take 1 tablet (50 mg total) by mouth daily. 04/19/16  Yes Rodolph BongEvan S Corey, MD  levofloxacin (LEVAQUIN) 500 MG tablet Take 1 tablet (500 mg total) by mouth daily. 04/29/16   Lattie HawStephen A Hence Derrick, MD    Family History Family History  Problem Relation Age of Onset  . Heart failure Mother   . Hyperlipidemia Mother   . Diabetes Mother   . Heart failure Father     Social History Social History  Substance Use Topics  . Smoking status: Current Every Day Smoker    Packs/day: 1.50    Years: 30.00  . Smokeless tobacco: Never Used  . Alcohol use 0.0 oz/week     Comment: 1/2 gallon of liquor per wk     Allergies   Varenicline; Varenicline tartrate; Advair diskus [fluticasone-salmeterol]; and Symbicort [budesonide-formoterol fumarate]   Review of Systems Review of Systems  Constitutional: Negative for activity change, appetite change, chills, diaphoresis and fatigue.  Gastrointestinal: Negative for abdominal pain.  Genitourinary: Positive for dysuria, frequency and urgency. Negative for decreased urine volume, discharge, flank pain, genital sores, hematuria, penile pain, scrotal swelling and testicular pain.  All other systems reviewed and are negative.    Physical Exam Triage Vital Signs ED Triage Vitals  Enc Vitals Group     BP 04/29/16 1358 130/93     Pulse Rate 04/29/16  1358 71     Resp --      Temp 04/29/16 1358 97.6 F (36.4 C)     Temp Source 04/29/16 1358 Oral     SpO2 04/29/16 1358 98 %     Weight 04/29/16 1358 199 lb (90.3 kg)     Height 04/29/16 1358 6\' 1"  (1.854 m)     Head Circumference --      Peak Flow --      Pain Score 04/29/16 1401 5     Pain Loc --      Pain Edu? --      Excl. in GC? --    No data found.   Updated Vital Signs BP 130/93 (BP Location: Left Arm)   Pulse 71   Temp 97.6 F (36.4 C)  (Oral)   Ht 6\' 1"  (1.854 m)   Wt 199 lb (90.3 kg)   SpO2 98%   BMI 26.25 kg/m   Visual Acuity Right Eye Distance:   Left Eye Distance:   Bilateral Distance:    Right Eye Near:   Left Eye Near:    Bilateral Near:     Physical Exam Nursing notes and Vital Signs reviewed. Appearance:  Patient appears stated age, and in no acute distress Eyes:  Pupils are equal, round, and reactive to light and accomodation.  Extraocular movement is intact.  Conjunctivae are not inflamed  Nose: Normal  Pharynx:  Normal; moist mucous membranes  Neck:  Supple.  No adenopathy  Lungs:  Clear to auscultation.  Breath sounds are equal.  Moving air well. Heart:  Regular rate and rhythm without murmurs, rubs, or gallops.  Abdomen:  Nontender without masses or hepatosplenomegaly.  Bowel sounds are present.  No CVA or flank tenderness.  Extremities:  No edema.  Skin:  No rash present.   Prastate exam deferred  UC Treatments / Results  Labs (all labs ordered are listed, but only abnormal results are displayed)  Labs Reviewed -   Review of previous lab results indicate that urine cultures have been consistently negative.  Last GC/chlamydia probe performed 01/20/15 was negative.  EKG  EKG Interpretation None       Radiology No results found.  Procedures Procedures (including critical care time)  Medications Ordered in UC Medications - No data to display   Initial Impression / Assessment and Plan / UC Course  I have reviewed the triage vital signs and the nursing notes.  Pertinent labs & imaging results that were available during my care of the patient were reviewed by me and considered in my medical decision making (see chart for details).  Clinical Course   History of chronically recurring prostatitis. Begin Levaquin 500mg  daily for two weeks. Increase fluid intake.  May take Ibuprofen 200mg , 4 tabs every 8 hours with food.  Patient declines culture Followup with urologist if not  improved two weeks.     Final Clinical Impressions(s) / UC Diagnoses   Final diagnoses:  Prostatitis, unspecified prostatitis type    New Prescriptions New Prescriptions   LEVOFLOXACIN (LEVAQUIN) 500 MG TABLET    Take 1 tablet (500 mg total) by mouth daily.     Lattie HawStephen A Leana Springston, MD 05/11/16 1030

## 2016-05-10 ENCOUNTER — Ambulatory Visit (INDEPENDENT_AMBULATORY_CARE_PROVIDER_SITE_OTHER): Payer: 59 | Admitting: Family Medicine

## 2016-05-10 ENCOUNTER — Encounter: Payer: Self-pay | Admitting: Family Medicine

## 2016-05-10 VITALS — BP 129/88 | HR 70 | Temp 99.2°F | Wt 201.0 lb

## 2016-05-10 DIAGNOSIS — J449 Chronic obstructive pulmonary disease, unspecified: Secondary | ICD-10-CM

## 2016-05-10 DIAGNOSIS — J069 Acute upper respiratory infection, unspecified: Secondary | ICD-10-CM

## 2016-05-10 MED ORDER — IPRATROPIUM BROMIDE 0.06 % NA SOLN: 1.0000 | Freq: Four times a day (QID) | NASAL | 0 refills | Status: AC | PRN

## 2016-05-10 NOTE — Progress Notes (Signed)
   Subjective:    Patient ID: Devin GillisVictor Carter, male    DOB: 11/20/1970, 45 y.o.   MRN: [id number]  Sore Throat   The current episode started today. The problem has been unchanged. The pain is worse on the right side. There has been no fever. Associated symptoms include trouble swallowing. Pertinent negatives include no shortness of breath. He has tried nothing for the symptoms.    Of note, he has a history of COPD/chronic bronchitis and continues to smoke. He denies shortness of breath, wheezing, and chest pain.  He is currently on Levaquin for prostatitis.  Past Medical History:  Diagnosis Date  . COPD (chronic obstructive pulmonary disease) (HCC)   . GERD (gastroesophageal reflux disease)   . Hx of pleurisy     Review of Systems  HENT: Positive for trouble swallowing.   Respiratory: Negative for shortness of breath.        Objective:   Physical Exam  HENT:  Nose: Mucosal edema: R>>L.  Mouth/Throat: Uvula is midline and mucous membranes are normal. No trismus in the jaw. Posterior oropharyngeal erythema present. No oropharyngeal exudate or tonsillar abscesses (R tonsil slightly larger than left).  Eyes: Conjunctivae, EOM and lids are normal.  Neck: Trachea normal and phonation normal. Neck supple.  Cardiovascular: Normal rate, regular rhythm and normal heart sounds.   Pulmonary/Chest: Effort normal. He has wheezes (scattered and coarse breath sounds).  Vitals reviewed.   Vitals:   05/10/16 1535  BP: 129/88  Pulse: 70  Temp: 99.2 F (37.3 C)        Assessment & Plan:  1. Chronic obstructive pulmonary disease, unspecified COPD type (HCC) Patient instructed to return in 2-4 weeks for spirometry   2. Acute upper respiratory infection - ipratropium (ATROVENT) 0.06 % nasal spray; Place 1 spray into both nostrils 4 (four) times daily as needed for rhinitis.  Dispense: 15 mL; Refill: 0 Instructed to use his Proair inhaler twice daily while sick  Given that patient has been  on Levaquin for approx 1 week and symptoms began this morning, feel strongly that this is a viral URI.  Normal progression of illness discussed Patient education provided Symptomatic management with OTC meds as needed: Tylenol for pharyngitis Netty pot for nasal congestion  I spent 25 minutes with this patient, greater than 50% was face-to-face time counseling regarding the above diagnoses

## 2016-05-10 NOTE — Assessment & Plan Note (Signed)
Take Albuterol inhaler twice daily for wheezing and/or shortness of breath.

## 2016-05-10 NOTE — Patient Instructions (Addendum)
Thank you for coming in today. Let me know if this worsens.   Pharyngitis Pharyngitis is redness, pain, and swelling (inflammation) of your pharynx. What are the causes? Pharyngitis is usually caused by infection. Most of the time, these infections are from viruses (viral) and are part of a cold. However, sometimes pharyngitis is caused by bacteria (bacterial). Pharyngitis can also be caused by allergies. Viral pharyngitis may be spread from person to person by coughing, sneezing, and personal items or utensils (cups, forks, spoons, toothbrushes). Bacterial pharyngitis may be spread from person to person by more intimate contact, such as kissing. What are the signs or symptoms? Symptoms of pharyngitis include:  Sore throat.  Tiredness (fatigue).  Low-grade fever.  Headache.  Joint pain and muscle aches.  Skin rashes.  Swollen lymph nodes.  Plaque-like film on throat or tonsils (often seen with bacterial pharyngitis). How is this diagnosed? Your health care provider will ask you questions about your illness and your symptoms. Your medical history, along with a physical exam, is often all that is needed to diagnose pharyngitis. Sometimes, a rapid strep test is done. Other lab tests may also be done, depending on the suspected cause. How is this treated? Viral pharyngitis will usually get better in 3-4 days without the use of medicine. Bacterial pharyngitis is treated with medicines that kill germs (antibiotics). Follow these instructions at home:  Drink enough water and fluids to keep your urine clear or pale yellow.  Only take over-the-counter or prescription medicines as directed by your health care provider:  If you are prescribed antibiotics, make sure you finish them even if you start to feel better.  Do not take aspirin.  Get lots of rest.  Gargle with 8 oz of salt water ( tsp of salt per 1 qt of water) as often as every 1-2 hours to soothe your throat.  Throat  lozenges (if you are not at risk for choking) or sprays may be used to soothe your throat. Contact a health care provider if:  You have large, tender lumps in your neck.  You have a rash.  You cough up green, yellow-brown, or bloody spit. Get help right away if:  Your neck becomes stiff.  You drool or are unable to swallow liquids.  You vomit or are unable to keep medicines or liquids down.  You have severe pain that does not go away with the use of recommended medicines.  You have trouble breathing (not caused by a stuffy nose). This information is not intended to replace advice given to you by your health care provider. Make sure you discuss any questions you have with your health care provider. Document Released: 05/23/2005 Document Revised: 10/29/2015 Document Reviewed: 01/28/2013 Elsevier Interactive Patient Education  2017 [employer]   Acute Bronchitis, Adult Acute bronchitis is sudden (acute) swelling of the air tubes (bronchi) in the lungs. Acute bronchitis causes these tubes to fill with mucus, which can make it hard to breathe. It can also cause coughing or wheezing. In adults, acute bronchitis usually goes away within 2 weeks. A cough caused by bronchitis may last up to 3 weeks. Smoking, allergies, and asthma can make the condition worse. Repeated episodes of bronchitis may cause further lung problems, such as chronic obstructive pulmonary disease (COPD). What are the causes? This condition can be caused by germs and by substances that irritate the lungs, including:  Cold and flu viruses. This condition is most often caused by the same virus that causes a cold.  Bacteria.  Exposure to tobacco smoke, dust, fumes, and air pollution. What increases the risk? This condition is more likely to develop in people who:  Have close contact with someone with acute bronchitis.  Are exposed to lung irritants, such as tobacco smoke, dust, fumes, and vapors.  Have a weak  immune system.  Have a respiratory condition such as asthma. What are the signs or symptoms? Symptoms of this condition include:  A cough.  Coughing up clear, yellow, or green mucus.  Wheezing.  Chest congestion.  Shortness of breath.  A fever.  Body aches.  Chills.  A sore throat. How is this diagnosed? This condition is usually diagnosed with a physical exam. During the exam, your health care provider may order tests, such as chest X-rays, to rule out other conditions. He or she may also:  Test a sample of your mucus for bacterial infection.  Check the level of oxygen in your blood. This is done to check for pneumonia.  Do a chest X-ray or lung function testing to rule out pneumonia and other conditions.  Perform blood tests. Your health care provider will also ask about your symptoms and medical history. How is this treated? Most cases of acute bronchitis clear up over time without treatment. Your health care provider may recommend:  Drinking more fluids. Drinking more makes your mucus thinner, which may make it easier to breathe.  Taking a medicine for a fever or cough.  Taking an antibiotic medicine.  Using an inhaler to help improve shortness of breath and to control a cough.  Using a cool mist vaporizer or humidifier to make it easier to breathe. Follow these instructions at home: Medicines  Take over-the-counter and prescription medicines only as told by your health care provider.  If you were prescribed an antibiotic, take it as told by your health care provider. Do not stop taking the antibiotic even if you start to feel better. General instructions  Get plenty of rest.  Drink enough fluids to keep your urine clear or pale yellow.  Avoid smoking and secondhand smoke. Exposure to cigarette smoke or irritating chemicals will make bronchitis worse. If you smoke and you need help quitting, ask your health care provider. Quitting smoking will help your  lungs heal faster.  Use an inhaler, cool mist vaporizer, or humidifier as told by your health care provider.  Keep all follow-up visits as told by your health care provider. This is important. How is this prevented? To lower your risk of getting this condition again:  Wash your hands often with soap and water. If soap and water are not available, use hand sanitizer.  Avoid contact with people who have cold symptoms.  Try not to touch your hands to your mouth, nose, or eyes.  Make sure to get the flu shot every year. Contact a health care provider if:  Your symptoms do not improve in 2 weeks of treatment. Get help right away if:  You cough up blood.  You have chest pain.  You have severe shortness of breath.  You become dehydrated.  You faint or keep feeling like you are going to faint.  You keep vomiting.  You have a severe headache.  Your fever or chills gets worse. This information is not intended to replace advice given to you by your health care provider. Make sure you discuss any questions you have with your health care provider. Document Released: 06/30/2004 Document Revised: 12/16/2015 Document Reviewed: 11/11/2015 Elsevier Interactive Patient Education  2017 [employer]  Peritonsillar Abscess Introduction A peritonsillar abscess is a collection of yellowish-white fluid (pus) in the back of the throat behind the tonsils. It usually occurs when an infection of the throat or tonsils (tonsillitis) spreads into the tissues around the tonsils. What are the causes? The infection that leads to a peritonsillar abscess is usually caused by streptococcal bacteria. What are the signs or symptoms?  Sore throat, often with pain on just one side.  Swelling and tenderness of the glands (lymph nodes) in the neck.  Difficulty swallowing.  Difficulty opening your mouth.  Fever.  Chills.  Drooling because of difficulty swallowing saliva.  Headache.  Changes in  your voice.  Bad breath. How is this diagnosed? Your health care provider will take your medical history and do a physical exam. Imaging tests may be done, such as an ultrasound or CT scan. A sample of pus may be removed from the abscess using a needle (needle aspiration) or by swabbing the back of your throat. This sample will be sent to a lab for testing. How is this treated? Treatment usually involves draining the pus from the abscess. This may be done through needle aspiration or by making an incision in the abscess. You will also likely need to take antibiotic medicine. Follow these instructions at home:  Rest as much as possible and get plenty of sleep.  Take medicines only as directed by your health care provider.  If you were prescribed an antibiotic medicine, finish it all even if you start to feel better.  If your abscess was drained by your health care provider, gargle with a mixture of salt and warm water:  Mix 1 tsp of salt in 8 oz of warm water.  Gargle with this mixture four times per day or as needed for comfort.  Do not swallow this mixture.  Drink plenty of fluids.  While your throat is sore, eat soft or liquid foods, such as frozen ice pops and ice cream.  Keep all follow-up visits as directed by your health care provider. This is important. Contact a health care provider if:  You have increased pain, swelling, redness, or drainage in your throat.  You develop a headache, a lack of energy (lethargy), or generalized feelings of illness.  You have a fever.  You feel dizzy.  You have difficulty swallowing or eating.  You show signs of becoming dehydrated, such as:  Light-headedness when standing.  Decreased urine output.  A fast heart rate.  Dry mouth. Get help right away if:  You have difficulty talking or breathing, or you find it easier to breathe when you lean forward.  You are coughing up blood or vomiting blood.  You have severe throat pain  that is not helped by medicines.  You start to drool. This information is not intended to replace advice given to you by your health care provider. Make sure you discuss any questions you have with your health care provider. Document Released: 05/23/2005 Document Revised: 10/29/2015 Document Reviewed: 01/06/2014  2017 Elsevier

## 2016-05-10 NOTE — Assessment & Plan Note (Signed)
Continues to smoke. Currently only taking Proair as needed.

## 2016-06-08 ENCOUNTER — Other Ambulatory Visit: Payer: Self-pay

## 2016-07-13 ENCOUNTER — Telehealth: Payer: Self-pay | Admitting: Physician Assistant

## 2016-07-13 NOTE — Telephone Encounter (Signed)
Left patient a voicemail on 07/08/16 inquiring about his annual flu shot.

## 2016-12-30 IMAGING — CR DG STERNUM 2+V
1 series · 1 of 1 positions shown · non-contrast
Comparison: None.

CLINICAL DATA: Inferior sternal mass for 3-4 years increasing in
size.

EXAM:
STERNUM - 2+ VIEW

[sternum lat]
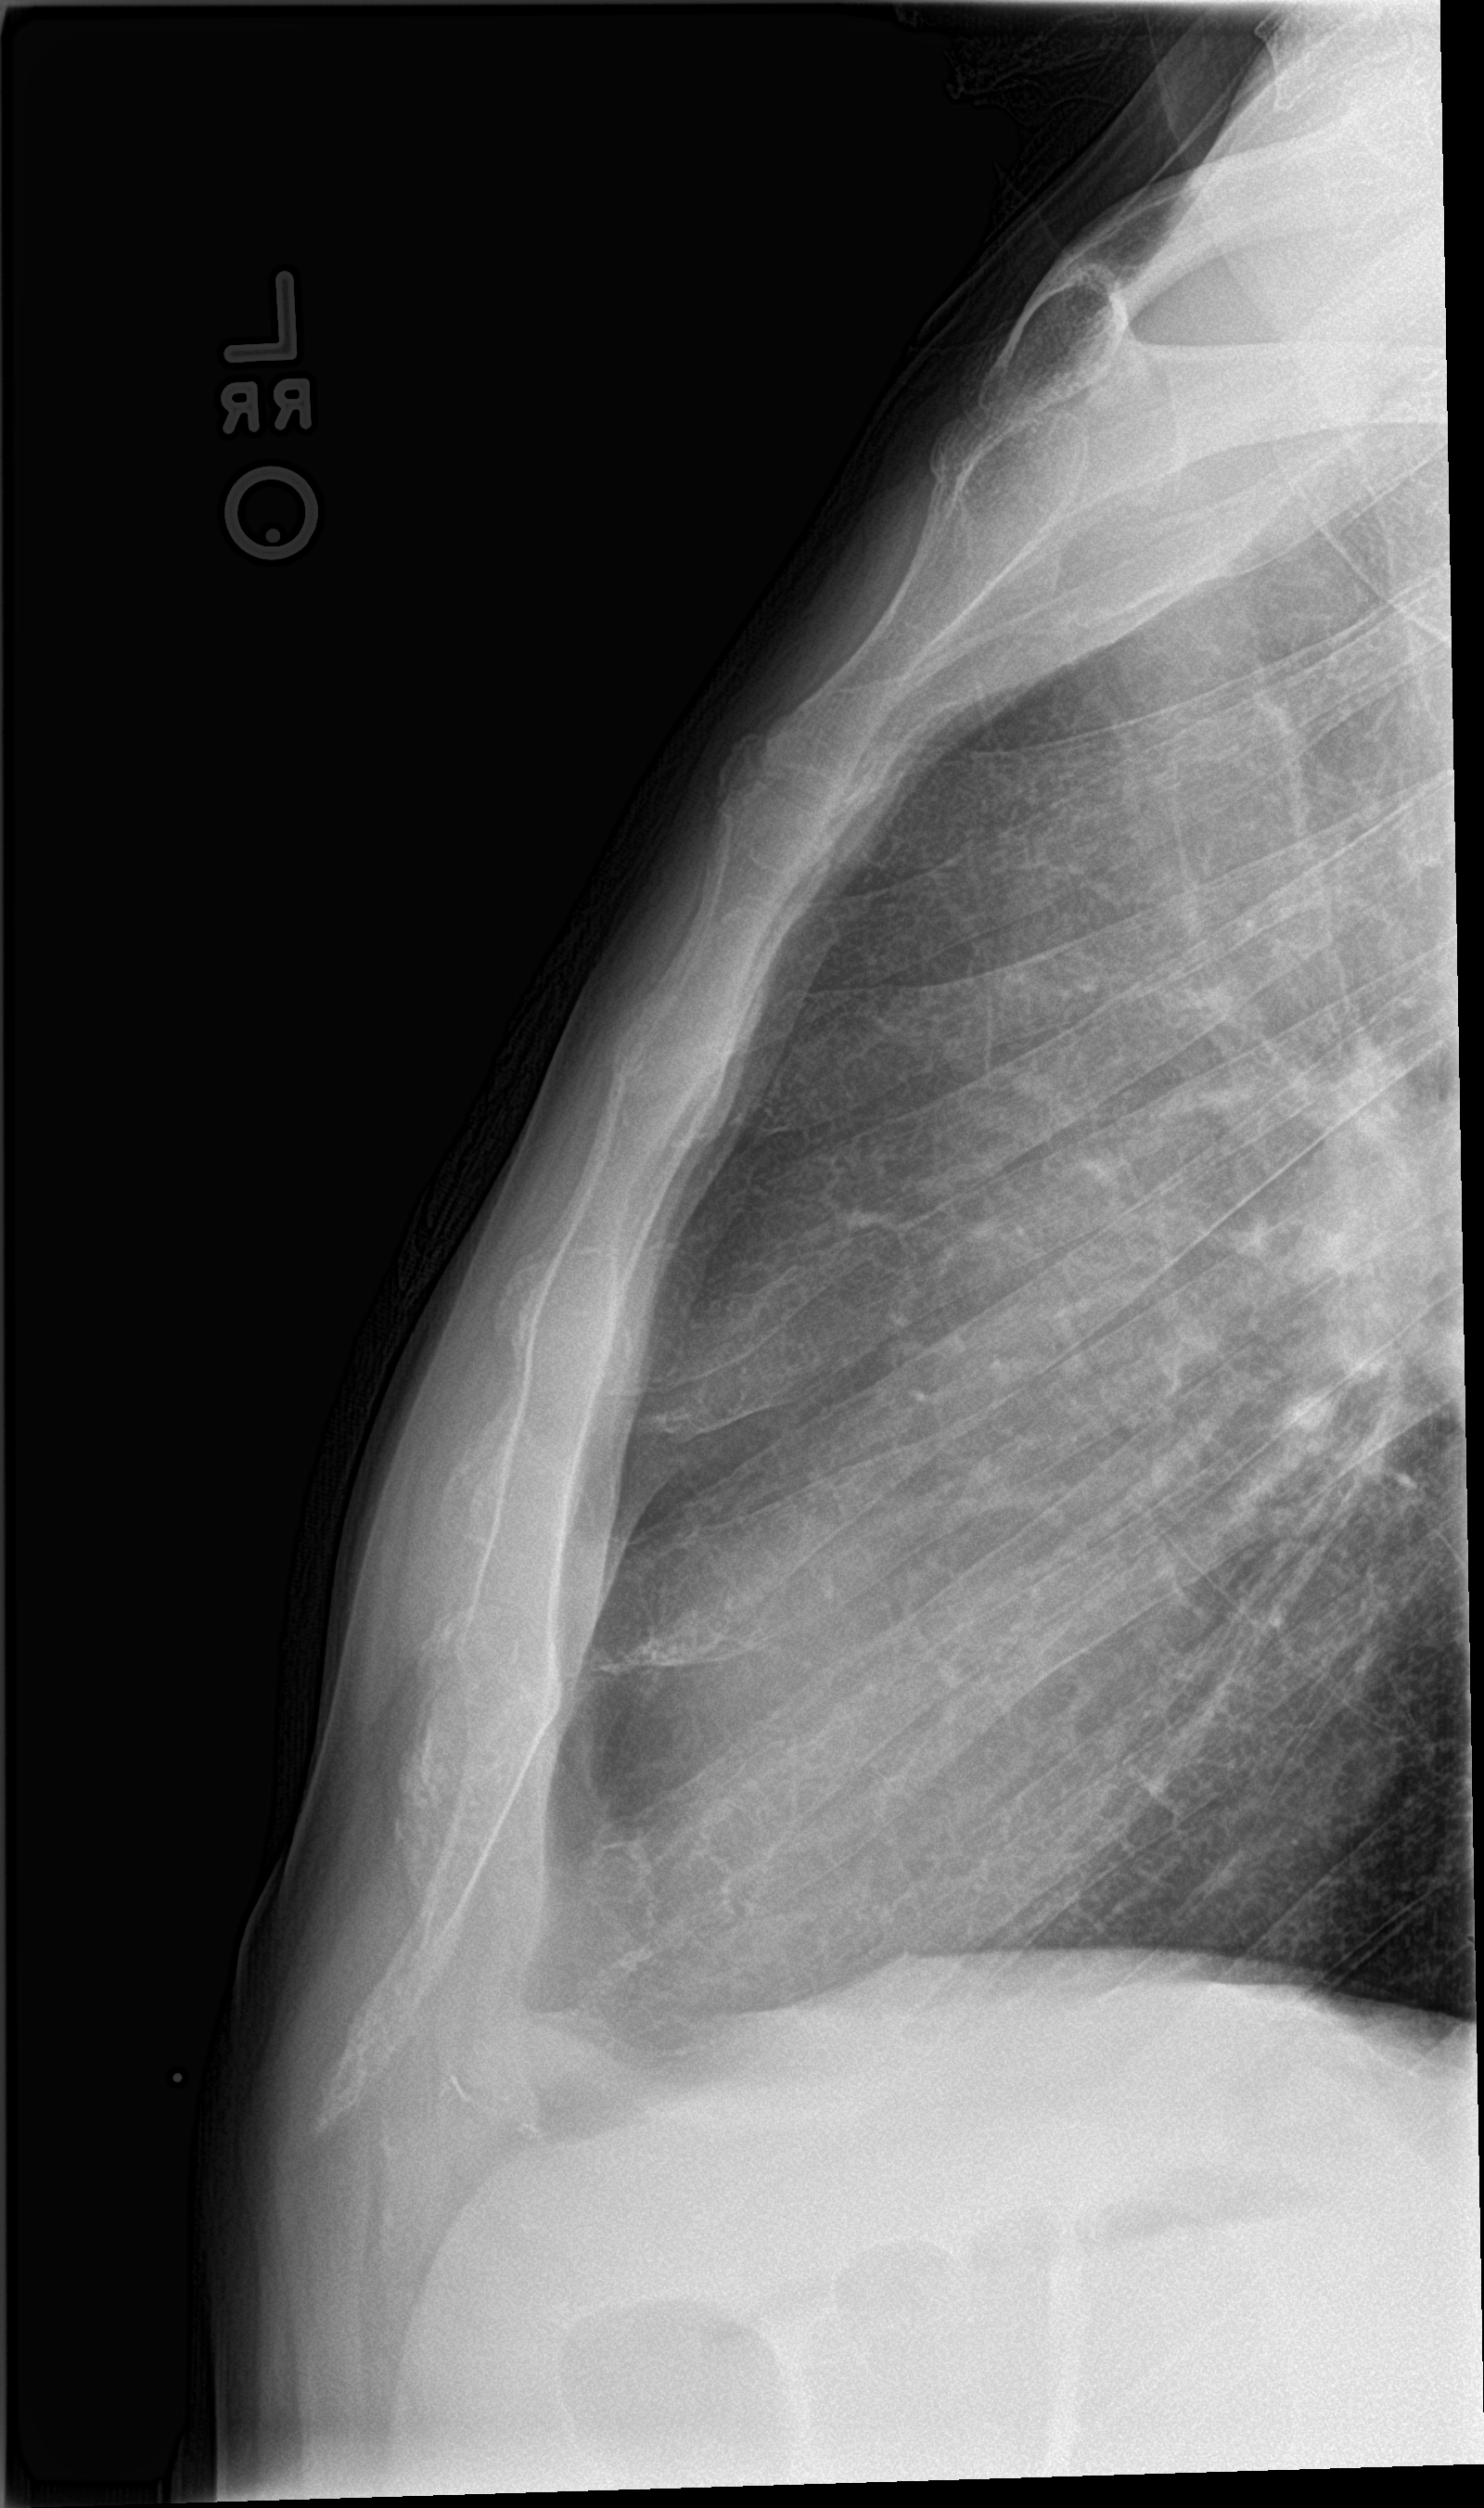

[1 of 1 positions shown; findings below may reference images not displayed]

FINDINGS: Prominence of the xiphoid process is noted which extends ventrally.
No mass identified. There is no evidence of fracture or other focal
bone lesions.
IMPRESSION: 1. No acute finding and no mass identified.
2. Prominent xiphoid process which may account for the mass.

## 2017-03-08 ENCOUNTER — Other Ambulatory Visit: Payer: Self-pay | Admitting: Family Medicine

## 2017-03-08 DIAGNOSIS — K219 Gastro-esophageal reflux disease without esophagitis: Secondary | ICD-10-CM

## 2018-03-18 ENCOUNTER — Other Ambulatory Visit: Payer: Self-pay | Admitting: Family Medicine

## 2018-03-18 DIAGNOSIS — K219 Gastro-esophageal reflux disease without esophagitis: Secondary | ICD-10-CM

## 2020-01-05 DEATH — deceased
